# Patient Record
Sex: Male | Born: 1965 | Race: Black or African American | Hispanic: No | Marital: Single | Smoking: Former smoker
Health system: Southern US, Community
[De-identification: ages and names within clinical notes are randomized; demographics above are authoritative.]

## PROBLEM LIST (undated history)

## (undated) DIAGNOSIS — M20012 Mallet finger of left finger(s): Secondary | ICD-10-CM

## (undated) DIAGNOSIS — I5022 Chronic systolic (congestive) heart failure: Secondary | ICD-10-CM

## (undated) DIAGNOSIS — I252 Old myocardial infarction: Secondary | ICD-10-CM

## (undated) DIAGNOSIS — I10 Essential (primary) hypertension: Secondary | ICD-10-CM

## (undated) DIAGNOSIS — M7661 Achilles tendinitis, right leg: Secondary | ICD-10-CM

## (undated) DIAGNOSIS — M1000 Idiopathic gout, unspecified site: Secondary | ICD-10-CM

## (undated) DIAGNOSIS — E785 Hyperlipidemia, unspecified: Secondary | ICD-10-CM

## (undated) DIAGNOSIS — E119 Type 2 diabetes mellitus without complications: Secondary | ICD-10-CM

## (undated) HISTORY — DX: Mallet finger of left finger(s): M20.012

## (undated) HISTORY — DX: Type 2 diabetes mellitus without complications: E11.9

## (undated) HISTORY — DX: Achilles tendinitis, right leg: M76.61

## (undated) HISTORY — PX: NO PAST SURGERIES: SHX2092

## (undated) HISTORY — DX: Hyperlipidemia, unspecified: E78.5

## (undated) HISTORY — DX: Essential (primary) hypertension: I10

## (undated) HISTORY — DX: Chronic systolic (congestive) heart failure: I50.22

## (undated) HISTORY — DX: Old myocardial infarction: I25.2

## (undated) HISTORY — DX: Idiopathic gout, unspecified site: M10.00

---

## 2016-01-02 DIAGNOSIS — Z6829 Body mass index (BMI) 29.0-29.9, adult: Secondary | ICD-10-CM | POA: Diagnosis not present

## 2016-01-02 DIAGNOSIS — I10 Essential (primary) hypertension: Secondary | ICD-10-CM | POA: Diagnosis not present

## 2016-01-02 DIAGNOSIS — M10072 Idiopathic gout, left ankle and foot: Secondary | ICD-10-CM | POA: Diagnosis not present

## 2016-04-01 DIAGNOSIS — I10 Essential (primary) hypertension: Secondary | ICD-10-CM | POA: Diagnosis not present

## 2016-04-01 DIAGNOSIS — M10032 Idiopathic gout, left wrist: Secondary | ICD-10-CM | POA: Diagnosis not present

## 2016-04-01 DIAGNOSIS — E1165 Type 2 diabetes mellitus with hyperglycemia: Secondary | ICD-10-CM | POA: Diagnosis not present

## 2016-04-22 DIAGNOSIS — R109 Unspecified abdominal pain: Secondary | ICD-10-CM | POA: Diagnosis not present

## 2016-04-22 DIAGNOSIS — R350 Frequency of micturition: Secondary | ICD-10-CM | POA: Diagnosis not present

## 2016-04-22 DIAGNOSIS — R300 Dysuria: Secondary | ICD-10-CM | POA: Diagnosis not present

## 2016-04-22 DIAGNOSIS — N390 Urinary tract infection, site not specified: Secondary | ICD-10-CM | POA: Diagnosis not present

## 2016-05-20 DIAGNOSIS — Z6832 Body mass index (BMI) 32.0-32.9, adult: Secondary | ICD-10-CM | POA: Diagnosis not present

## 2016-05-20 DIAGNOSIS — N410 Acute prostatitis: Secondary | ICD-10-CM | POA: Diagnosis not present

## 2016-09-15 DIAGNOSIS — T33539A Superficial frostbite of unspecified finger(s), initial encounter: Secondary | ICD-10-CM | POA: Diagnosis not present

## 2016-10-05 DIAGNOSIS — E11319 Type 2 diabetes mellitus with unspecified diabetic retinopathy without macular edema: Secondary | ICD-10-CM | POA: Diagnosis not present

## 2016-11-11 DIAGNOSIS — I10 Essential (primary) hypertension: Secondary | ICD-10-CM | POA: Diagnosis not present

## 2016-11-11 DIAGNOSIS — M1000 Idiopathic gout, unspecified site: Secondary | ICD-10-CM | POA: Diagnosis not present

## 2016-11-11 DIAGNOSIS — E1165 Type 2 diabetes mellitus with hyperglycemia: Secondary | ICD-10-CM | POA: Diagnosis not present

## 2016-11-11 DIAGNOSIS — Z113 Encounter for screening for infections with a predominantly sexual mode of transmission: Secondary | ICD-10-CM | POA: Diagnosis not present

## 2016-11-11 DIAGNOSIS — E782 Mixed hyperlipidemia: Secondary | ICD-10-CM | POA: Diagnosis not present

## 2016-11-13 DIAGNOSIS — E782 Mixed hyperlipidemia: Secondary | ICD-10-CM | POA: Diagnosis not present

## 2016-11-13 DIAGNOSIS — N410 Acute prostatitis: Secondary | ICD-10-CM | POA: Diagnosis not present

## 2016-11-13 DIAGNOSIS — R0789 Other chest pain: Secondary | ICD-10-CM | POA: Diagnosis not present

## 2016-11-13 DIAGNOSIS — I10 Essential (primary) hypertension: Secondary | ICD-10-CM | POA: Diagnosis not present

## 2016-11-13 DIAGNOSIS — E1165 Type 2 diabetes mellitus with hyperglycemia: Secondary | ICD-10-CM | POA: Diagnosis not present

## 2016-11-13 DIAGNOSIS — R3129 Other microscopic hematuria: Secondary | ICD-10-CM | POA: Diagnosis not present

## 2016-11-13 DIAGNOSIS — M10072 Idiopathic gout, left ankle and foot: Secondary | ICD-10-CM | POA: Diagnosis not present

## 2017-01-04 DIAGNOSIS — R0602 Shortness of breath: Secondary | ICD-10-CM | POA: Diagnosis not present

## 2017-01-04 DIAGNOSIS — R079 Chest pain, unspecified: Secondary | ICD-10-CM | POA: Diagnosis not present

## 2017-01-04 DIAGNOSIS — R931 Abnormal findings on diagnostic imaging of heart and coronary circulation: Secondary | ICD-10-CM | POA: Diagnosis not present

## 2017-01-05 DIAGNOSIS — E782 Mixed hyperlipidemia: Secondary | ICD-10-CM | POA: Diagnosis not present

## 2017-01-05 DIAGNOSIS — I10 Essential (primary) hypertension: Secondary | ICD-10-CM | POA: Diagnosis not present

## 2017-01-05 DIAGNOSIS — E1165 Type 2 diabetes mellitus with hyperglycemia: Secondary | ICD-10-CM | POA: Diagnosis not present

## 2017-01-05 DIAGNOSIS — M1000 Idiopathic gout, unspecified site: Secondary | ICD-10-CM | POA: Diagnosis not present

## 2017-01-06 ENCOUNTER — Encounter: Payer: Self-pay | Admitting: *Deleted

## 2017-01-07 ENCOUNTER — Encounter: Payer: Self-pay | Admitting: Cardiovascular Disease

## 2017-01-07 ENCOUNTER — Ambulatory Visit (INDEPENDENT_AMBULATORY_CARE_PROVIDER_SITE_OTHER): Payer: BLUE CROSS/BLUE SHIELD | Admitting: Cardiovascular Disease

## 2017-01-07 VITALS — BP 144/90 | HR 76 | Ht 72.0 in | Wt 223.8 lb

## 2017-01-07 DIAGNOSIS — R0602 Shortness of breath: Secondary | ICD-10-CM | POA: Diagnosis not present

## 2017-01-07 DIAGNOSIS — E7849 Other hyperlipidemia: Secondary | ICD-10-CM

## 2017-01-07 DIAGNOSIS — I25118 Atherosclerotic heart disease of native coronary artery with other forms of angina pectoris: Secondary | ICD-10-CM | POA: Diagnosis not present

## 2017-01-07 DIAGNOSIS — E784 Other hyperlipidemia: Secondary | ICD-10-CM | POA: Diagnosis not present

## 2017-01-07 DIAGNOSIS — I10 Essential (primary) hypertension: Secondary | ICD-10-CM | POA: Diagnosis not present

## 2017-01-07 DIAGNOSIS — R0609 Other forms of dyspnea: Secondary | ICD-10-CM

## 2017-01-07 DIAGNOSIS — R9439 Abnormal result of other cardiovascular function study: Secondary | ICD-10-CM | POA: Diagnosis not present

## 2017-01-07 DIAGNOSIS — I209 Angina pectoris, unspecified: Secondary | ICD-10-CM | POA: Diagnosis not present

## 2017-01-07 DIAGNOSIS — R0600 Dyspnea, unspecified: Secondary | ICD-10-CM

## 2017-01-07 MED ORDER — ISOSORBIDE MONONITRATE ER 30 MG PO TB24: 30.0000 mg | ORAL_TABLET | Freq: Every day | ORAL | 6 refills | Status: DC

## 2017-01-07 MED ORDER — ASPIRIN EC 81 MG PO TBEC: 81.0000 mg | DELAYED_RELEASE_TABLET | Freq: Every day | ORAL | Status: DC

## 2017-01-07 MED ORDER — NITROGLYCERIN 0.4 MG SL SUBL: 0.4000 mg | SUBLINGUAL_TABLET | SUBLINGUAL | 3 refills | Status: AC | PRN

## 2017-01-07 MED ORDER — ROSUVASTATIN CALCIUM 5 MG PO TABS: 5.0000 mg | ORAL_TABLET | Freq: Every day | ORAL | 6 refills | Status: DC

## 2017-01-07 NOTE — Progress Notes (Signed)
CARDIOLOGY CONSULT NOTE  Patient ID: ALEK BORGES MRN: [id number] DOB/AGE: [birth year] 51 y.o.  Admit date: (Not on file) Primary Physician: Juliette Alcide, MD Referring Physician: Leandrew Koyanagi  Reason for Consultation: chest pain, abnormal stress test  HPI: Travis Paul is a 51 y.o. male who is being seen today for the evaluation of chest pain and an abnormal stress test at the request of Burdine, Ananias Pilgrim, MD.   He also has a history of hypertension and diabetes.  I reviewed all office documentation, labs, and studies.  Nuclear stress test performed at [facility] on 01/04/17 showed prior inferior wall infarct with no evidence of ischemia. There was inferior wall hypokinesis, LVEF 49%.  I personally reviewed the ECG performed on 11/12/16 which showed normal sinus rhythm with no ischemic ST segment or T-wave abnormalities, nor any arrhythmias. RSR prime pattern noted in V1.  HbA1c 6.3% on 11/11/16. BUN 11, creatinine 0.93, total cholesterol 218, triglycerides 647, HDL 42. LDL could not be calculated. Hemoglobin 12.9, platelets 283.  He continues to experience retrosternal chest pain and left precordial pain with exertion. It has been going on for one to two months. He denies associated nausea, vomiting, lightheadedness, dizziness, and syncope. He once broke out in a sweat with it.  He also has significant shortness of breath with exertion such as walking 50 yards. He was even short of breath walking from our parking lot into the office building.  There is no radiation of the chest pain into the jaw, back, neck, or arms.  He quit smoking in 2012.  He once had chest pain earlier this month alleviated with aspirin.  Blood pressure is elevated today, 144/90.  He tried statins and developed significant cramps.  ECG performed in the office today which I ordered and personally interpreted demonstrates normal sinus rhythm with no ischemic ST segment or T-wave  abnormalities, nor any arrhythmias.   Family history: Mother had CABG at 75.   No Known Allergies  Current Outpatient Prescriptions  Medication Sig Dispense Refill  . allopurinol (ZYLOPRIM) 300 MG tablet Take 300 mg by mouth daily.    Marland Kitchen amLODipine (NORVASC) 10 MG tablet Take 10 mg by mouth daily.    Marland Kitchen aspirin 325 MG tablet Take 325 mg by mouth daily.    . Chromium-Cinnamon (CINNAMON PLUS CHROMIUM) [phone] MCG-MG CAPS Take by mouth.    Boris Lown Oil (OMEGA-3) 500 MG CAPS Take by mouth.    Marland Kitchen lisinopril (PRINIVIL,ZESTRIL) 40 MG tablet Take 40 mg by mouth daily.    . metFORMIN (GLUCOPHAGE) 1000 MG tablet Take 1,000 mg by mouth 2 (two) times daily with a meal.    . metoprolol succinate (TOPROL-XL) 25 MG 24 hr tablet Take 25 mg by mouth daily.     No current facility-administered medications for this visit.     Past Medical History:  Diagnosis Date  . Achilles tendinitis, right leg   . Chronic systolic heart failure (HCC)   . Diabetes mellitus without complication (HCC)   . Hyperlipidemia   . Hypertension   . Idiopathic gout, unspecified site   . Mallet finger of left hand   . Old myocardial infarction     History reviewed. No pertinent surgical history.  Social History   Social History  . Marital status: Unknown    Spouse name: N/A  . Number of children: N/A  . Years of education: N/A   Occupational History  . Not on file.   Social  History Main Topics  . Smoking status: Former Smoker    Packs/day: 1.00    Types: Cigarettes    Quit date: 2012  . Smokeless tobacco: Never Used  . Alcohol use Not on file  . Drug use: Unknown  . Sexual activity: Not on file   Other Topics Concern  . Not on file   Social History Narrative  . No narrative on file       Current Meds  Medication Sig  . allopurinol (ZYLOPRIM) 300 MG tablet Take 300 mg by mouth daily.  Marland Kitchen amLODipine (NORVASC) 10 MG tablet Take 10 mg by mouth daily.  Marland Kitchen aspirin 325 MG tablet Take 325 mg by mouth  daily.  . Chromium-Cinnamon (CINNAMON PLUS CHROMIUM) [phone] MCG-MG CAPS Take by mouth.  Boris Lown Oil (OMEGA-3) 500 MG CAPS Take by mouth.  Marland Kitchen lisinopril (PRINIVIL,ZESTRIL) 40 MG tablet Take 40 mg by mouth daily.  . metFORMIN (GLUCOPHAGE) 1000 MG tablet Take 1,000 mg by mouth 2 (two) times daily with a meal.  . metoprolol succinate (TOPROL-XL) 25 MG 24 hr tablet Take 25 mg by mouth daily.      Review of systems complete and found to be negative unless listed above in HPI    Physical exam Blood pressure (!) 144/90, pulse 76, height 6' (1.829 m), weight 223 lb 12.8 oz (101.5 kg), SpO2 98 %. General: NAD Neck: No JVD, no thyromegaly or thyroid nodule.  Lungs: Diminished throughout, no Rales or wheezes. CV: Nondisplaced PMI. Regular rate and rhythm, normal S1/S2, no S3/S4, no murmur.  No peripheral edema.  No carotid bruit.    Abdomen: Soft, nontender, no distention.  Skin: Intact without lesions or rashes.  Neurologic: Alert and oriented x 3.  Psych: Normal affect. Extremities: No clubbing or cyanosis.  HEENT: Normal.   ECG: Most recent ECG reviewed.   Labs: No results found for: K, BUN, CREATININE, ALT, TSH, HGB   Lipids: No results found for: LDLCALC, LDLDIRECT, CHOL, TRIG, HDL      ASSESSMENT AND PLAN:  1. CAD with angina and exertional dyspnea: Appears to have CCS class III angina. I will attempt to control medically before considering coronary angiography. I will start Imdur 30 mg daily. I will start statin therapy with Crestor 5 mg. Already on ASA and a beta blocker. I will reduce ASA to 81 mg.  I will also prescribe SL nitroglycerin. No ischemia with stress testing. I will order a 2-D echocardiogram with Doppler to evaluate cardiac structure, function, and regional wall motion.  2. Hyperlipidemia: Lipids reviewed above. I will start statin therapy with Crestor 5 mg. I will repeat lipids in 3 months. He did develop cramps with statins in the past.  3. Hypertension:  Elevated. He is already on amlodipine 10 mg and lisinopril 40 mg. I will start Imdur 30 mg for chest pain and monitor BP.  Disposition: Follow up in 1 month  Signed: Prentice Docker, M.D., F.A.C.C.  01/07/2017, 11:13 AM

## 2017-01-07 NOTE — Patient Instructions (Signed)
Medication Instructions:   Begin Imdur  daily.  Begin Nitroglycerin 0.4mg  as needed for severe chest pain.  Begin Crestor  daily.  Decrease Aspirin to  daily.  Continue all other medications.    Labwork:  Lipids - order given today.    Due in 3 months (around April 08, 2017).  Office will contact with results via phone or letter.    Testing/Procedures:  Your physician has requested that you have an echocardiogram. Echocardiography is a painless test that uses sound waves to create images of your heart. It provides your doctor with information about the size and shape of your heart and how well your heart's chambers and valves are working. This procedure takes approximately one hour. There are no restrictions for this procedure.  Office will contact with results via phone or letter.    Follow-Up: 1 month   Any Other Special Instructions Will Be Listed Below (If Applicable).  If you need a refill on your cardiac medications before your next appointment, please call your pharmacy.

## 2017-01-08 ENCOUNTER — Encounter: Payer: Self-pay | Admitting: Cardiovascular Disease

## 2017-01-13 ENCOUNTER — Ambulatory Visit (INDEPENDENT_AMBULATORY_CARE_PROVIDER_SITE_OTHER): Payer: BLUE CROSS/BLUE SHIELD

## 2017-01-13 ENCOUNTER — Other Ambulatory Visit: Payer: Self-pay

## 2017-01-13 DIAGNOSIS — R0602 Shortness of breath: Secondary | ICD-10-CM

## 2017-01-13 DIAGNOSIS — I209 Angina pectoris, unspecified: Secondary | ICD-10-CM

## 2017-01-13 LAB — ECHOCARDIOGRAM COMPLETE
AO mean calculated velocity dopler: 112 cm/s
AOVTI: 26.9 cm
AV Mean grad: 6 mmHg
AV pk vel: 168 cm/s
AVPG: 11 mmHg
CHL CUP TV REG PEAK VELOCITY: 156 cm/s
E decel time: 301 msec
EERAT: 6.69
FS: 41 % (ref 28–44)
IVS/LV PW RATIO, ED: 1.05
LA diam end sys: 40 mm
LA diam index: 1.79 cm/m2
LA vol A4C: 29.4 ml
LA vol index: 16.7 mL/m2
LA vol: 37.3 mL
LASIZE: 40 mm
LDCA: 2.84 cm2
LV E/e'average: 6.69
LV PW d: 12.7 mm — AB (ref 0.6–1.1)
LV dias vol: 40 mL — AB (ref 62–150)
LV e' LATERAL: 9.28 cm/s
LV sys vol index: 5 mL/m2
LV sys vol: 11 mL — AB (ref 21–61)
LVDIAVOLIN: 18 mL/m2
LVEEMED: 6.69
LVOT diameter: 19 mm
MV Dec: 301
MV pk A vel: 108 m/s
MV pk E vel: 62.1 m/s
RV LATERAL S' VELOCITY: 16.5 cm/s
RV TAPSE: 19.1 mm
RV sys press: 13 mmHg
Simpson's disk: 72
Stroke v: 29 ml
TDI e' lateral: 9.28
TDI e' medial: 8.51
TR max vel: 156 cm/s

## 2017-01-14 ENCOUNTER — Telehealth: Payer: Self-pay | Admitting: *Deleted

## 2017-01-14 DIAGNOSIS — I5022 Chronic systolic (congestive) heart failure: Secondary | ICD-10-CM | POA: Diagnosis not present

## 2017-01-14 DIAGNOSIS — I10 Essential (primary) hypertension: Secondary | ICD-10-CM | POA: Diagnosis not present

## 2017-01-14 DIAGNOSIS — M7661 Achilles tendinitis, right leg: Secondary | ICD-10-CM | POA: Diagnosis not present

## 2017-01-14 DIAGNOSIS — E782 Mixed hyperlipidemia: Secondary | ICD-10-CM | POA: Diagnosis not present

## 2017-01-14 NOTE — Telephone Encounter (Signed)
Notes recorded by Lesle ChrisAngela G Hill, LPN on 4/0/98115/11/2016 at 10:16 AM EDT Patient notified. Copy to pmd. Follow up scheduled for 02/10/2017 with Dr. Purvis SheffieldKoneswaran. ------  Notes recorded by Lesle ChrisAngela G Hill, LPN on 9/1/47825/11/2016 at 9:44 AM EDT Left message to return call.  ------  Notes recorded by Laqueta LindenSuresh A Koneswaran, MD on 01/13/2017 at 4:53 PM EDT Excellent pumping function.

## 2017-02-10 ENCOUNTER — Encounter: Payer: Self-pay | Admitting: Cardiovascular Disease

## 2017-02-10 ENCOUNTER — Ambulatory Visit (INDEPENDENT_AMBULATORY_CARE_PROVIDER_SITE_OTHER): Payer: BLUE CROSS/BLUE SHIELD | Admitting: Cardiovascular Disease

## 2017-02-10 VITALS — BP 133/85 | HR 67 | Ht 72.0 in | Wt 218.4 lb

## 2017-02-10 DIAGNOSIS — I209 Angina pectoris, unspecified: Secondary | ICD-10-CM | POA: Diagnosis not present

## 2017-02-10 DIAGNOSIS — I10 Essential (primary) hypertension: Secondary | ICD-10-CM | POA: Diagnosis not present

## 2017-02-10 DIAGNOSIS — R0600 Dyspnea, unspecified: Secondary | ICD-10-CM

## 2017-02-10 DIAGNOSIS — R9439 Abnormal result of other cardiovascular function study: Secondary | ICD-10-CM | POA: Diagnosis not present

## 2017-02-10 DIAGNOSIS — R0602 Shortness of breath: Secondary | ICD-10-CM

## 2017-02-10 DIAGNOSIS — E784 Other hyperlipidemia: Secondary | ICD-10-CM

## 2017-02-10 DIAGNOSIS — I25118 Atherosclerotic heart disease of native coronary artery with other forms of angina pectoris: Secondary | ICD-10-CM

## 2017-02-10 DIAGNOSIS — R0609 Other forms of dyspnea: Secondary | ICD-10-CM | POA: Diagnosis not present

## 2017-02-10 DIAGNOSIS — E7849 Other hyperlipidemia: Secondary | ICD-10-CM

## 2017-02-10 NOTE — Patient Instructions (Signed)
Medication Instructions:  Continue all current medications.  Labwork: none  Testing/Procedures: none  Follow-Up: 3 months   Any Other Special Instructions Will Be Listed Below (If Applicable).  If you need a refill on your cardiac medications before your next appointment, please call your pharmacy.  

## 2017-02-10 NOTE — Progress Notes (Signed)
SUBJECTIVE: The patient presents for follow-up of coronary artery disease with angina and exertional dyspnea.  Nuclear stress test performed at [facility] on 01/04/17 showed prior inferior wall infarct with no evidence of ischemia. There was inferior wall hypokinesis, LVEF 49%.  At his last visit I ordered an echocardiogram which was performed on 01/13/17 and demonstrated vigorous left ventricular systolic function, LVEF 70-75%, mild LVH, and grade 1 diastolic dysfunction. Pulmonary pressures were normal.  He is feeling much better after the addition of Imdur 30 mg. He currently denies chest pain and said his shortness of breath has significantly improved. He is trying to eat better and has questions about olive oil and steaming meats. He is also trying to walk more.  [place] at this office visit. He was in [place] last week.   Review of Systems: As per "subjective", otherwise negative.  No Known Allergies  Current Outpatient Prescriptions  Medication Sig Dispense Refill  . allopurinol (ZYLOPRIM) 300 MG tablet Take 300 mg by mouth daily.    Marland Kitchen. amLODipine (NORVASC) 10 MG tablet Take 10 mg by mouth daily.    Marland Kitchen. aspirin EC 81 MG tablet Take 1 tablet (81 mg total) by mouth daily.    . Chromium-Cinnamon (CINNAMON PLUS CHROMIUM) [phone] MCG-MG CAPS Take by mouth.    . isosorbide mononitrate (IMDUR) 30 MG 24 hr tablet Take 1 tablet (30 mg total) by mouth daily. 30 tablet 6  . Krill Oil (OMEGA-3) 500 MG CAPS Take by mouth.    Marland Kitchen. lisinopril (PRINIVIL,ZESTRIL) 40 MG tablet Take 40 mg by mouth daily.    . metFORMIN (GLUCOPHAGE) 1000 MG tablet Take 1,000 mg by mouth 2 (two) times daily with a meal.    . metoprolol succinate (TOPROL-XL) 25 MG 24 hr tablet Take 25 mg by mouth daily.    . nitroGLYCERIN (NITROSTAT) 0.4 MG SL tablet Place 1 tablet (0.4 mg total) under the tongue every 5 (five) minutes as needed for chest pain. 25 tablet 3  .  rosuvastatin (CRESTOR) 5 MG tablet Take 1 tablet (5 mg total) by mouth daily. 30 tablet 6   No current facility-administered medications for this visit.     Past Medical History:  Diagnosis Date  . Achilles tendinitis, right leg   . Chronic systolic heart failure (HCC)   . Diabetes mellitus without complication (HCC)   . Hyperlipidemia   . Hypertension   . Idiopathic gout, unspecified site   . Mallet finger of left hand   . Old myocardial infarction     History reviewed. No pertinent surgical history.  Social History   Social History  . Marital status: Unknown    Spouse name: N/A  . Number of children: N/A  . Years of education: N/A   Occupational History  . Not on file.   Social History Main Topics  . Smoking status: Former Smoker    Packs/day: 1.00    Types: Cigarettes    Quit date: 2012  . Smokeless tobacco: Never Used  . Alcohol use Not on file  . Drug use: Unknown  . Sexual activity: Not on file   Other Topics Concern  . Not on file   Social History Narrative  . No narrative on file     Vitals:   02/10/17 0940  BP: 133/85  Pulse: 67  SpO2: 100%  Weight: 218 lb 6.4 oz (99.1 kg)  Height: 6' (1.829 m)    [employer]  Readings from Last 3 Encounters:  02/10/17 218 lb 6.4 oz (99.1 kg)  01/07/17 223 lb 12.8 oz (101.5 kg)  01/05/17 226 lb (102.5 kg)     PHYSICAL EXAM General: NAD HEENT: Normal. Neck: No JVD, no thyromegaly. Lungs: Diminished throughout, no crackles or wheezes. CV: Nondisplaced PMI.  Regular rate and rhythm, normal S1/S2, no S3/S4, no murmur. No pretibial or periankle edema.     Abdomen: Soft, nontender, no distention.  Neurologic: Alert and oriented.  Psych: Normal affect. Skin: Normal. Musculoskeletal: No gross deformities.    ECG: Most recent ECG reviewed.   Labs: No results found for: K, BUN, CREATININE, ALT, TSH, HGB   Lipids: No results found for: LDLCALC, LDLDIRECT, CHOL, TRIG, HDL     ASSESSMENT AND PLAN: 1. CAD with  angina and exertional dyspnea: Symptomatically improved after the addition of Imdur 30 mg. He is tolerating Crestor 5 mg. Continue aspirin 81 mg and beta blocker. There was no ischemia with stress testing. He has vigorous left ventricular systolic function.  2. Hyperlipidemia: Lipids previously reviewed. I started Crestor 5 mg at his last visit which he is tolerating. Lipids to be repeated in 2 months. He did develop cramps with statins in the past.  3. Hypertension: Currently controlled on amlodipine 10 mg, lisinopril 40 mg, and Imdur 30 mg. No changes.    Disposition: Follow up 3 months.  Prentice Docker, M.D., F.A.C.C.

## 2017-04-13 ENCOUNTER — Ambulatory Visit: Payer: BLUE CROSS/BLUE SHIELD | Admitting: Cardiovascular Disease

## 2017-05-18 DIAGNOSIS — E1165 Type 2 diabetes mellitus with hyperglycemia: Secondary | ICD-10-CM | POA: Diagnosis not present

## 2017-05-18 DIAGNOSIS — E782 Mixed hyperlipidemia: Secondary | ICD-10-CM | POA: Diagnosis not present

## 2017-05-18 DIAGNOSIS — I10 Essential (primary) hypertension: Secondary | ICD-10-CM | POA: Diagnosis not present

## 2017-05-19 ENCOUNTER — Telehealth: Payer: Self-pay | Admitting: Cardiovascular Disease

## 2017-05-19 DIAGNOSIS — I25119 Atherosclerotic heart disease of native coronary artery with unspecified angina pectoris: Secondary | ICD-10-CM | POA: Diagnosis not present

## 2017-05-19 DIAGNOSIS — E782 Mixed hyperlipidemia: Secondary | ICD-10-CM | POA: Diagnosis not present

## 2017-05-19 DIAGNOSIS — I5022 Chronic systolic (congestive) heart failure: Secondary | ICD-10-CM | POA: Diagnosis not present

## 2017-05-19 DIAGNOSIS — E1165 Type 2 diabetes mellitus with hyperglycemia: Secondary | ICD-10-CM | POA: Diagnosis not present

## 2017-05-19 NOTE — Telephone Encounter (Signed)
Please give Dr. Leandrew KoyanagiBurdine [phone] a call concerning the pt's lipid profile.

## 2017-05-19 NOTE — Telephone Encounter (Signed)
Spoke with Clinton SawyerKailey at Dr Debbora PrestoBurdines office who says nurses have left for the day and she didn't see anything documented in pt chart - LM

## 2017-05-20 ENCOUNTER — Ambulatory Visit (INDEPENDENT_AMBULATORY_CARE_PROVIDER_SITE_OTHER): Payer: BLUE CROSS/BLUE SHIELD | Admitting: Cardiovascular Disease

## 2017-05-20 ENCOUNTER — Encounter: Payer: Self-pay | Admitting: Cardiovascular Disease

## 2017-05-20 VITALS — BP 119/77 | HR 85 | Ht 72.0 in | Wt 230.0 lb

## 2017-05-20 DIAGNOSIS — I209 Angina pectoris, unspecified: Secondary | ICD-10-CM

## 2017-05-20 DIAGNOSIS — I10 Essential (primary) hypertension: Secondary | ICD-10-CM | POA: Diagnosis not present

## 2017-05-20 DIAGNOSIS — E781 Pure hyperglyceridemia: Secondary | ICD-10-CM

## 2017-05-20 DIAGNOSIS — E785 Hyperlipidemia, unspecified: Secondary | ICD-10-CM | POA: Diagnosis not present

## 2017-05-20 DIAGNOSIS — I25118 Atherosclerotic heart disease of native coronary artery with other forms of angina pectoris: Secondary | ICD-10-CM | POA: Diagnosis not present

## 2017-05-20 NOTE — Telephone Encounter (Signed)
Spoke with Clinton SawyerKailey at Dr Burdine's office says they will fax recent OV regarding medication changes for pt - pt was seen by Dr Purvis SheffieldKoneswaran today

## 2017-05-20 NOTE — Progress Notes (Signed)
SUBJECTIVE: The patient presents for follow-up of coronary artery disease with angina and exertional dyspnea.  Nuclear stress [facility] performed at [facility] on 01/04/17 showed prior inferior wall infarct with no evidence of ischemia. There was inferior wall hypokinesis, LVEF 49%.  Echocardiogram on 01/13/17 and demonstrated vigorous left ventricular systolic function, LVEF 70-75%, mild LVH, and grade 1 diastolic dysfunction. Pulmonary pressures were normal.  He is feeling very well and denies chest pain and shortness of breath.  Labs were checked 2 days ago and triglycerides were severely elevated at 2070. They had been 647 in February. Total cholesterol was 229 HDL was 24. Hemoglobin 12.7, platelets 212.  He said his PCP made some medication adjustments but he has yet to go fill his prescriptions and does not know what they are at present.    Review of Systems: As per "subjective", otherwise negative.  No Known Allergies  Current Outpatient Prescriptions  Medication Sig Dispense Refill  . allopurinol (ZYLOPRIM) 300 MG tablet Take 300 mg by mouth daily.    Marland Kitchen. amLODipine (NORVASC) 10 MG tablet Take 10 mg by mouth daily.    Marland Kitchen. aspirin EC 81 MG tablet Take 1 tablet (81 mg total) by mouth daily.    . Chromium-Cinnamon (CINNAMON PLUS CHROMIUM) [phone] MCG-MG CAPS Take by mouth.    . isosorbide mononitrate (IMDUR) 30 MG 24 hr tablet Take 1 tablet (30 mg total) by mouth daily. 30 tablet 6  . Krill Oil (OMEGA-3) 500 MG CAPS Take by mouth.    Marland Kitchen. lisinopril (PRINIVIL,ZESTRIL) 40 MG tablet Take 40 mg by mouth daily.    . metFORMIN (GLUCOPHAGE) 1000 MG tablet Take 1,000 mg by mouth 2 (two) times daily with a meal.    . metoprolol succinate (TOPROL-XL) 25 MG 24 hr tablet Take 25 mg by mouth daily.    . nitroGLYCERIN (NITROSTAT) 0.4 MG SL tablet Place 1 tablet (0.4 mg total) under the tongue every 5 (five) minutes as needed for chest pain. 25 tablet 3  . rosuvastatin (CRESTOR) 5 MG tablet  Take 1 tablet (5 mg total) by mouth daily. 30 tablet 6   No current facility-administered medications for this visit.     Past Medical History:  Diagnosis Date  . Achilles tendinitis, right leg   . Chronic systolic heart failure (HCC)   . Diabetes mellitus without complication (HCC)   . Hyperlipidemia   . Hypertension   . Idiopathic gout, unspecified site   . Mallet finger of left hand   . Old myocardial infarction     Past Surgical History:  Procedure Laterality Date  . NO PAST SURGERIES      Social History   Social History  . Marital status: Unknown    Spouse name: N/A  . Number of children: N/A  . Years of education: N/A   Occupational History  . Not on file.   Social History Main Topics  . Smoking status: Former Smoker    Packs/day: 1.00    Types: Cigarettes    Quit date: 2012  . Smokeless tobacco: Never Used  . Alcohol use Not on file  . Drug use: Unknown  . Sexual activity: Not on file   Other Topics Concern  . Not on file   Social History Narrative  . No narrative on file     Vitals:   05/20/17 1313  Weight: 230 lb (104.3 kg)  Height: 6' (1.829 m)    Wt Readings from Last 3 Encounters:  05/20/17  230 lb (104.3 kg)  02/10/17 218 lb 6.4 oz (99.1 kg)  01/07/17 223 lb 12.8 oz (101.5 kg)     PHYSICAL EXAM General: NAD HEENT: Normal. Neck: No JVD, no thyromegaly. Lungs: Diminished throughout, no crackles or wheezes. CV: Nondisplaced PMI.  Regular rate and rhythm, normal S1/S2, no S3/S4, no murmur. No pretibial or periankle edema.  No carotid bruit.   Abdomen: Soft, nontender, no distention.  Neurologic: Alert and oriented.  Psych: Normal affect. Skin: Normal. Musculoskeletal: No gross deformities.    ECG: Most recent ECG reviewed.   Labs: No results found for: K, BUN, CREATININE, ALT, TSH, HGB   Lipids: No results found for: LDLCALC, LDLDIRECT, CHOL, TRIG, HDL     ASSESSMENT AND PLAN:  1. CAD with angina and exertional dyspnea:  Symptomatically improved after the addition of Imdur 30 mg. He is tolerating Crestor 5 mg. Continue aspirin 81 mg and beta blocker. There was no ischemia with stress testing. He has vigorous left ventricular systolic function.  2. Dyslipidemia with severe hypertriglyceridemia: Labs were checked 2 days ago and triglycerides were severely elevated at 2070. They had been 647 in February. Total cholesterol was 229 HDL was 24. He said his PCP made some medication adjustments but he has yet to go fill his prescriptions and does not know what they are at present.The patient will go have his prescriptions filled and inform us of his new medications and dosages. He is at risk for pancreatitis.  3. Hypertension: Currently controlled. No changes.     Disposition: Follow up 6 months.   Prentice Docker, M.D., F.A.C.C.

## 2017-05-20 NOTE — Patient Instructions (Signed)

## 2017-05-28 ENCOUNTER — Other Ambulatory Visit: Payer: Self-pay | Admitting: *Deleted

## 2017-05-28 MED ORDER — AMLODIPINE BESY-BENAZEPRIL HCL 10-40 MG PO CAPS
1.0000 | ORAL_CAPSULE | Freq: Every day | ORAL | Status: DC
Start: 2017-05-28 — End: 2019-06-06

## 2017-05-28 MED ORDER — ROSUVASTATIN CALCIUM 10 MG PO TABS: 10.0000 mg | ORAL_TABLET | Freq: Every day | ORAL | Status: DC

## 2017-05-28 MED ORDER — OMEGA-3 KRILL OIL 1000 MG PO CAPS: 1.0000 | ORAL_CAPSULE | Freq: Two times a day (BID) | ORAL | Status: DC

## 2017-05-28 MED ORDER — FENOFIBRATE 160 MG PO TABS: 160.0000 mg | ORAL_TABLET | Freq: Every day | ORAL | Status: DC

## 2017-08-12 DIAGNOSIS — E1165 Type 2 diabetes mellitus with hyperglycemia: Secondary | ICD-10-CM | POA: Diagnosis not present

## 2017-08-12 DIAGNOSIS — I5022 Chronic systolic (congestive) heart failure: Secondary | ICD-10-CM | POA: Diagnosis not present

## 2017-08-12 DIAGNOSIS — E782 Mixed hyperlipidemia: Secondary | ICD-10-CM | POA: Diagnosis not present

## 2017-08-12 DIAGNOSIS — I10 Essential (primary) hypertension: Secondary | ICD-10-CM | POA: Diagnosis not present

## 2017-08-16 DIAGNOSIS — E1165 Type 2 diabetes mellitus with hyperglycemia: Secondary | ICD-10-CM | POA: Diagnosis not present

## 2017-08-16 DIAGNOSIS — I10 Essential (primary) hypertension: Secondary | ICD-10-CM | POA: Diagnosis not present

## 2017-08-16 DIAGNOSIS — M10072 Idiopathic gout, left ankle and foot: Secondary | ICD-10-CM | POA: Diagnosis not present

## 2017-08-16 DIAGNOSIS — Z1389 Encounter for screening for other disorder: Secondary | ICD-10-CM | POA: Diagnosis not present

## 2017-08-16 DIAGNOSIS — E782 Mixed hyperlipidemia: Secondary | ICD-10-CM | POA: Diagnosis not present

## 2017-08-20 ENCOUNTER — Other Ambulatory Visit: Payer: Self-pay | Admitting: Cardiovascular Disease

## 2018-02-04 DIAGNOSIS — Z0001 Encounter for general adult medical examination with abnormal findings: Secondary | ICD-10-CM | POA: Diagnosis not present

## 2018-02-08 DIAGNOSIS — Z0001 Encounter for general adult medical examination with abnormal findings: Secondary | ICD-10-CM | POA: Diagnosis not present

## 2018-02-08 DIAGNOSIS — Z6831 Body mass index (BMI) 31.0-31.9, adult: Secondary | ICD-10-CM | POA: Diagnosis not present

## 2018-02-28 DIAGNOSIS — Z114 Encounter for screening for human immunodeficiency virus [HIV]: Secondary | ICD-10-CM | POA: Diagnosis not present

## 2018-02-28 DIAGNOSIS — Z113 Encounter for screening for infections with a predominantly sexual mode of transmission: Secondary | ICD-10-CM | POA: Diagnosis not present

## 2018-04-22 ENCOUNTER — Ambulatory Visit: Payer: BLUE CROSS/BLUE SHIELD | Admitting: Cardiovascular Disease

## 2018-05-30 ENCOUNTER — Ambulatory Visit: Payer: BLUE CROSS/BLUE SHIELD | Admitting: Cardiovascular Disease

## 2018-06-11 ENCOUNTER — Other Ambulatory Visit: Payer: Self-pay | Admitting: Cardiovascular Disease

## 2018-06-16 DIAGNOSIS — E1165 Type 2 diabetes mellitus with hyperglycemia: Secondary | ICD-10-CM | POA: Diagnosis not present

## 2018-06-16 DIAGNOSIS — E782 Mixed hyperlipidemia: Secondary | ICD-10-CM | POA: Diagnosis not present

## 2018-06-16 DIAGNOSIS — R3121 Asymptomatic microscopic hematuria: Secondary | ICD-10-CM | POA: Diagnosis not present

## 2018-06-16 DIAGNOSIS — M10072 Idiopathic gout, left ankle and foot: Secondary | ICD-10-CM | POA: Diagnosis not present

## 2018-06-16 DIAGNOSIS — I10 Essential (primary) hypertension: Secondary | ICD-10-CM | POA: Diagnosis not present

## 2018-09-13 ENCOUNTER — Ambulatory Visit: Payer: BLUE CROSS/BLUE SHIELD | Admitting: Cardiovascular Disease

## 2018-09-13 ENCOUNTER — Encounter: Payer: Self-pay | Admitting: Cardiovascular Disease

## 2018-09-13 VITALS — BP 132/72 | HR 77 | Ht 72.0 in | Wt 245.0 lb

## 2018-09-13 DIAGNOSIS — I10 Essential (primary) hypertension: Secondary | ICD-10-CM

## 2018-09-13 DIAGNOSIS — I25118 Atherosclerotic heart disease of native coronary artery with other forms of angina pectoris: Secondary | ICD-10-CM

## 2018-09-13 DIAGNOSIS — E785 Hyperlipidemia, unspecified: Secondary | ICD-10-CM

## 2018-09-13 NOTE — Patient Instructions (Addendum)

## 2018-09-13 NOTE — Progress Notes (Signed)
SUBJECTIVE: The patient presents for follow-up of coronary artery disease.  Nuclear stress test performed at [facility] on 01/04/17 showed prior inferior wall infarct with no evidence of ischemia. There was inferior wall hypokinesis, LVEF 49%.  Echocardiogram on 01/13/17 and demonstrated vigorous left ventricular systolic function, LVEF 70-75%, mild LVH, and grade 1 diastolic dysfunction. Pulmonary pressures were normal.  He has some occasional shortness of breath which he attributes to weight gain.  He weighs 245 pounds today and weighed 230 pounds on 05/20/2017 and prior to that he weighed 218 pounds on 02/10/2017.  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  He had some chest pains alleviated with belching the other night but otherwise denies exertional chest pain.  He also denies palpitations, orthopnea, and leg swelling.  ECG performed in the office today which I ordered and personally interpreted demonstrates normal sinus rhythm with early repolarization changes.   Review of Systems: As per "subjective", otherwise negative.  No Known Allergies  Current Outpatient Medications  Medication Sig Dispense Refill  . allopurinol (ZYLOPRIM) 300 MG tablet Take 300 mg by mouth daily.    Marland Kitchen. amLODipine-benazepril (LOTREL) 10-40 MG capsule Take 1 capsule by mouth daily.    Marland Kitchen. aspirin EC 81 MG tablet Take 1 tablet (81 mg total) by mouth daily.    . Chromium-Cinnamon (CINNAMON PLUS CHROMIUM) [phone] MCG-MG CAPS Take 1 capsule by mouth 2 (two) times daily.    . fenofibrate 160 MG tablet Take 1 tablet (160 mg total) by mouth daily.    . isosorbide mononitrate (IMDUR) 30 MG 24 hr tablet TAKE 1 TABLET BY MOUTH ONCE DAILY 90 tablet 0  . metFORMIN (GLUCOPHAGE) 1000 MG tablet Take 1,000 mg by mouth 2 (two) times daily with a meal.    . nitroGLYCERIN (NITROSTAT) 0.4 MG SL tablet Place 1 tablet (0.4 mg total) under the tongue every 5 (five) minutes as needed for chest pain. 25  tablet 3  . Omega-3 Krill Oil 1000 MG CAPS Take 1 capsule by mouth 2 (two) times daily.    . rosuvastatin (CRESTOR) 10 MG tablet Take 1 tablet (10 mg total) by mouth daily.     No current facility-administered medications for this visit.     Past Medical History:  Diagnosis Date  . Achilles tendinitis, right leg   . Chronic systolic heart failure (HCC)   . Diabetes mellitus without complication (HCC)   . Hyperlipidemia   . Hypertension   . Idiopathic gout, unspecified site   . Mallet finger of left hand   . Old myocardial infarction     Past Surgical History:  Procedure Laterality Date  . NO PAST SURGERIES      Social History   Socioeconomic History  . Marital status: Unknown    Spouse name: Not on file  . Number of children: Not on file  . Years of education: Not on file  . Highest education level: Not on file  Occupational History  . Not on file  Social Needs  . Financial resource strain: Not on file  . Food insecurity:    Worry: Not on file    Inability: Not on file  . Transportation needs:    Medical: Not on file    Non-medical: Not on file  Tobacco Use  . Smoking status: Former Smoker    Packs/day: 1.00    Types: Cigarettes    Last attempt to quit: 2012    Years since quitting:  8.0  . Smokeless tobacco: Never Used  Substance and Sexual Activity  . Alcohol use: Not on file  . Drug use: Not on file  . Sexual activity: Not on file  Lifestyle  . Physical activity:    Days per week: Not on file    Minutes per session: Not on file  . Stress: Not on file  Relationships  . Social connections:    Talks on phone: Not on file    Gets together: Not on file    Attends religious service: Not on file    Active member of club or organization: Not on file    Attends meetings of clubs or organizations: Not on file    Relationship status: Not on file  . Intimate partner violence:    Fear of current or ex partner: Not on file    Emotionally abused: Not on file     Physically abused: Not on file    Forced sexual activity: Not on file  Other Topics Concern  . Not on file  Social History Narrative  . Not on file     Vitals:   09/13/18 1044  BP: 132/72  Pulse: 77  SpO2: 95%  Weight: 245 lb (111.1 kg)  Height: 6' (1.829 m)    Wt Readings from Last 3 Encounters:  09/13/18 245 lb (111.1 kg)  05/20/17 230 lb (104.3 kg)  02/10/17 218 lb 6.4 oz (99.1 kg)     PHYSICAL EXAM General: NAD HEENT: Normal. Neck: No JVD, no thyromegaly. Lungs: Diminished sounds throughout, no crackles or wheezes. CV: Regular rate and rhythm, normal S1/S2, no S3/S4, no murmur. No pretibial or periankle edema.  No carotid bruit.   Abdomen: Soft, nontender, no distention.  Neurologic: Alert and oriented.  Psych: Normal affect. Skin: Normal. Musculoskeletal: No gross deformities.    ECG: Reviewed above under Subjective   Labs: No results found for: K, BUN, CREATININE, ALT, TSH, HGB   Lipids: No results found for: LDLCALC, LDLDIRECT, CHOL, TRIG, HDL     ASSESSMENT AND PLAN: 1.  Coronary artery disease: Symptomatically stable.  Currently on aspirin and Crestor along with Imdur.  No longer on beta-blockers.  I emphasized the importance of increasing physical activity levels for weight loss.  2.  Dyslipidemia: I will obtain a copy of lipids from PCP.  He has a history of severe hypertriglyceridemia.  Currently on Crestor and fenofibrate.  3.  Hypertension: Blood pressure is normal.  No changes.    Disposition: Follow up 1 year   Prentice DockerSuresh Koneswaran, M.D., F.A.C.C.

## 2018-09-16 ENCOUNTER — Encounter: Payer: Self-pay | Admitting: *Deleted

## 2018-11-15 DIAGNOSIS — I5022 Chronic systolic (congestive) heart failure: Secondary | ICD-10-CM | POA: Diagnosis not present

## 2018-11-15 DIAGNOSIS — E1165 Type 2 diabetes mellitus with hyperglycemia: Secondary | ICD-10-CM | POA: Diagnosis not present

## 2018-11-15 DIAGNOSIS — I10 Essential (primary) hypertension: Secondary | ICD-10-CM | POA: Diagnosis not present

## 2018-11-15 DIAGNOSIS — E782 Mixed hyperlipidemia: Secondary | ICD-10-CM | POA: Diagnosis not present

## 2018-12-02 DIAGNOSIS — I5022 Chronic systolic (congestive) heart failure: Secondary | ICD-10-CM | POA: Diagnosis not present

## 2018-12-02 DIAGNOSIS — R3121 Asymptomatic microscopic hematuria: Secondary | ICD-10-CM | POA: Diagnosis not present

## 2018-12-02 DIAGNOSIS — E1165 Type 2 diabetes mellitus with hyperglycemia: Secondary | ICD-10-CM | POA: Diagnosis not present

## 2018-12-02 DIAGNOSIS — I10 Essential (primary) hypertension: Secondary | ICD-10-CM | POA: Diagnosis not present

## 2018-12-20 ENCOUNTER — Other Ambulatory Visit: Payer: Self-pay | Admitting: Cardiovascular Disease

## 2018-12-21 ENCOUNTER — Encounter: Payer: Self-pay | Admitting: Internal Medicine

## 2019-02-13 ENCOUNTER — Encounter: Payer: Self-pay | Admitting: Internal Medicine

## 2019-03-06 ENCOUNTER — Ambulatory Visit: Payer: BLUE CROSS/BLUE SHIELD

## 2019-06-06 ENCOUNTER — Ambulatory Visit (INDEPENDENT_AMBULATORY_CARE_PROVIDER_SITE_OTHER): Payer: Self-pay | Admitting: *Deleted

## 2019-06-06 ENCOUNTER — Other Ambulatory Visit: Payer: Self-pay

## 2019-06-06 DIAGNOSIS — Z86010 Personal history of colonic polyps: Secondary | ICD-10-CM

## 2019-06-06 MED ORDER — PEG 3350-KCL-NA BICARB-NACL 420 G PO SOLR: 4000.0000 mL | Freq: Once | ORAL | 0 refills | Status: AC

## 2019-06-06 NOTE — Progress Notes (Signed)
Gastroenterology Pre-Procedure Review  Request Date: 06/06/2019 Requesting Physician: Dr. Judd Lien @ [facility], Last TCS 08/17/2011 done by Dr. Truddie Hidden, sessile polyp  PATIENT REVIEW QUESTIONS: The patient responded to the following health history questions as indicated:    1. Diabetes Melitis: Yes 2. Joint replacements in the past 12 months: No 3. Major health problems in the past 3 months: No 4. Has an artificial valve or MVP: No 5. Has a defibrillator: No 6. Has been advised in past to take antibiotics in advance of a procedure like teeth cleaning: No 7. Family history of colon cancer: No  8. Alcohol Use: Yes, 1 beer on weekends 9. Illicit drug Use: No 10. History of sleep apnea: No  11. History of coronary artery or other vascular stents placed within the last 12 months: No 12. History of any prior anesthesia complications: No 13. There is no height or weight on file to calculate BMI. Ht: 5'11 Wt: 240 lbs    MEDICATIONS & ALLERGIES:    Patient reports the following regarding taking any blood thinners:   Plavix? No Aspirin? Yes Coumadin? No Brilinta? No Xarelto? No Eliquis? No Pradaxa? No Savaysa? No Effient? No  Patient confirms/reports the following medications:  Current Outpatient Medications  Medication Sig Dispense Refill  . allopurinol (ZYLOPRIM) 300 MG tablet Take 300 mg by mouth daily.    Marland Kitchen amLODipine (NORVASC) 10 MG tablet Take 10 mg by mouth daily.    Marland Kitchen aspirin EC 81 MG tablet Take 1 tablet (81 mg total) by mouth daily.    . benazepril (LOTENSIN) 40 MG tablet Take 40 mg by mouth daily.    . Chromium-Cinnamon (CINNAMON PLUS CHROMIUM) [phone] MCG-MG CAPS Take 1 capsule by mouth as needed.     . fenofibrate 160 MG tablet Take 1 tablet (160 mg total) by mouth daily.    . isosorbide mononitrate (IMDUR) 30 MG 24 hr tablet Take 1 tablet by mouth once daily 90 tablet 3  . metFORMIN (GLUCOPHAGE) 1000 MG tablet Take 1,000 mg by mouth 2 (two) times daily with a meal.     . nitroGLYCERIN (NITROSTAT) 0.4 MG SL tablet Place 1 tablet (0.4 mg total) under the tongue every 5 (five) minutes as needed for chest pain. (Patient taking differently: Place 0.4 mg under the tongue as needed for chest pain. ) 25 tablet 3  . rosuvastatin (CRESTOR) 10 MG tablet Take 1 tablet (10 mg total) by mouth daily.     No current facility-administered medications for this visit.     Patient confirms/reports the following allergies:  No Known Allergies  No orders of the defined types were placed in this encounter.   AUTHORIZATION INFORMATION Primary Insurance:BCBS [place],  ID #:[id number] ,  Group #: [id number] Pre-Cert / Auth required: No, not required  SCHEDULE INFORMATION: Procedure has been scheduled as follows:  Date: 07/21/2019, Time: 9:30 Location: [facility] with Dr. Gala Romney  This Gastroenterology Pre-Precedure Review Form is being routed to the following provider(s): Walden Field, NP

## 2019-06-06 NOTE — Patient Instructions (Signed)
Travis Paul   Apr 08, 1966 MRN: [id number]    Procedure Date: 07/21/2019 Time to register: 8:30 am Place to register: [facility] Procedure Time: 9:30 am Scheduled provider: Dr. Gala Romney  PREPARATION FOR COLONOSCOPY WITH TRI-LYTE SPLIT PREP  Please notify us immediately if you are diabetic, take iron supplements, or if you are on Coumadin or any other blood thinners.   Please hold the following medications: See letter  You will need to purchase 1 fleet enema and 1 box of Bisacodyl 67m tablets.   2 DAYS BEFORE PROCEDURE:  DATE: 07/19/2019   DAY: Wednesday Begin clear liquid diet AFTER your lunch meal. NO SOLID FOODS after this point.  1 DAY BEFORE PROCEDURE:  DATE: 07/20/2019   DAY: Thursday Continue clear liquids the entire day - NO SOLID FOOD.   Diabetic medications adjustments for today: See letter  At 2:00 pm:  Take 2 Bisacodyl tablets.   At 4:00pm:  Start drinking your solution. Make sure you mix well per instructions on the bottle. Try to drink 1 (one) 8 ounce glass every 10-15 minutes until you have consumed HALF the jug. You should complete by 6:00pm.You must keep the left over solution refrigerated until completed next day.  Continue clear liquids. You must drink plenty of clear liquids to prevent dehyration and kidney failure.     DAY OF PROCEDURE:   DATE: 07/21/2019   DAY: Friday If you take medications for your heart, blood pressure or breathing, you may take these medications.  Diabetic medications adjustments for today: See letter Five hours before your procedure time @ 4:30 am:  Finish remaining amout of bowel prep, drinking 1 (one) 8 ounce glass every 10-15 minutes until complete. You have two hours to consume remaining prep.   Three hours before your procedure time @ 6:30 am:  Nothing by mouth.   At least one hour before going to the hospital:  Give yourself one Fleet enema. You may take your morning medications with sip of water unless we have  instructed otherwise.      Please see below for Dietary Information.  CLEAR LIQUIDS INCLUDE:  Water Jello (NOT red in color)   Ice Popsicles (NOT red in color)   Tea (sugar ok, no milk/cream) Powdered fruit flavored drinks  Coffee (sugar ok, no milk/cream) Gatorade/ Lemonade/ Kool-Aid  (NOT red in color)   Juice: apple, white grape, white cranberry Soft drinks  Clear bullion, consomme, broth (fat free beef/chicken/vegetable)  Carbonated beverages (any kind)  Strained chicken noodle soup Hard Candy   Remember: Clear liquids are liquids that will allow you to see your fingers on the other side of a clear glass. Be sure liquids are NOT red in color, and not cloudy, but CLEAR.  DO NOT EAT OR DRINK ANY OF THE FOLLOWING:  Dairy products of any kind   Cranberry juice Tomato juice / V8 juice   Grapefruit juice Orange juice     Red grape juice  Do not eat any solid foods, including such foods as: cereal, oatmeal, yogurt, fruits, vegetables, creamed soups, eggs, bread, crackers, pureed foods in a blender, etc.   HELPFUL HINTS FOR DRINKING PREP SOLUTION:   Make sure prep is extremely cold. Mix and refrigerate the the morning of the prep. You may also put in the freezer.   You may try mixing some Crystal Light or Country Time Lemonade if you prefer. Mix in small amounts; add more if necessary.  Try drinking through a straw  Rinse mouth  with water or a mouthwash between glasses, to remove after-taste.  Try sipping on a cold beverage /ice/ popsicles between glasses of prep.  Place a piece of sugar-free hard candy in mouth between glasses.  If you become nauseated, try consuming smaller amounts, or stretch out the time between glasses. Stop for 30-60 minutes, then slowly start back drinking.        OTHER INSTRUCTIONS  You will need a responsible adult at least 55 years of age to accompany you and drive you home. This person must remain in the waiting room during your procedure. The  hospital will cancel your procedure if you do not have a responsible adult with you.   1. Wear loose fitting clothing that is easily removed. 2. Leave jewelry and other valuables at home.  3. Remove all body piercing jewelry and leave at home. 4. Total time from sign-in until discharge is approximately 2-3 hours. 5. You should go home directly after your procedure and rest. You can resume normal activities the day after your procedure. 6. The day of your procedure you should not:  Drive  Make legal decisions  Operate machinery  Drink alcohol  Return to work   You may call the office (Dept: [phone]) before 5:00pm, or page the doctor on call ([phone]) after 5:00pm, for further instructions, if necessary.   Insurance Information YOU WILL NEED TO CHECK WITH YOUR INSURANCE COMPANY FOR THE BENEFITS OF COVERAGE YOU HAVE FOR THIS PROCEDURE.  UNFORTUNATELY, NOT ALL INSURANCE COMPANIES HAVE BENEFITS TO COVER ALL OR PART OF THESE TYPES OF PROCEDURES.  IT IS YOUR RESPONSIBILITY TO CHECK YOUR BENEFITS, HOWEVER, WE WILL BE GLAD TO ASSIST YOU WITH ANY CODES YOUR INSURANCE COMPANY MAY NEED.    PLEASE NOTE THAT MOST INSURANCE COMPANIES WILL NOT COVER A SCREENING COLONOSCOPY FOR PEOPLE UNDER THE AGE OF 50  IF YOU HAVE BCBS INSURANCE, YOU MAY HAVE BENEFITS FOR A SCREENING COLONOSCOPY BUT IF POLYPS ARE FOUND THE DIAGNOSIS WILL CHANGE AND THEN YOU MAY HAVE A DEDUCTIBLE THAT WILL NEED TO BE MET. SO PLEASE MAKE SURE YOU CHECK YOUR BENEFITS FOR A SCREENING COLONOSCOPY AS WELL AS A DIAGNOSTIC COLONOSCOPY.  

## 2019-06-07 ENCOUNTER — Encounter: Payer: Self-pay | Admitting: *Deleted

## 2019-06-07 NOTE — Addendum Note (Signed)
Addended by: Metro Kung on: 06/07/2019 03:46 PM   Modules accepted: Orders, SmartSet

## 2019-06-07 NOTE — Progress Notes (Signed)
Ok to schedule.  DM meds: none morning of.  On prep day: Check CBG ac and hs as well (if they normally check their blood sugar) as if the patient feels like their blood sugar is off. Can use soda, juice (that's in [place]) as needed for any low blood sugar.  Check CBG on arrival to endo unit.

## 2019-06-15 DIAGNOSIS — J439 Emphysema, unspecified: Secondary | ICD-10-CM | POA: Diagnosis not present

## 2019-06-15 DIAGNOSIS — S27322A Contusion of lung, bilateral, initial encounter: Secondary | ICD-10-CM | POA: Diagnosis not present

## 2019-06-15 DIAGNOSIS — R05 Cough: Secondary | ICD-10-CM | POA: Diagnosis not present

## 2019-06-15 DIAGNOSIS — J9811 Atelectasis: Secondary | ICD-10-CM | POA: Diagnosis not present

## 2019-06-15 DIAGNOSIS — J432 Centrilobular emphysema: Secondary | ICD-10-CM | POA: Diagnosis not present

## 2019-06-15 DIAGNOSIS — Z1159 Encounter for screening for other viral diseases: Secondary | ICD-10-CM | POA: Diagnosis not present

## 2019-06-15 DIAGNOSIS — E669 Obesity, unspecified: Secondary | ICD-10-CM | POA: Diagnosis not present

## 2019-06-15 DIAGNOSIS — Y92413 State road as the place of occurrence of the external cause: Secondary | ICD-10-CM | POA: Diagnosis not present

## 2019-06-15 DIAGNOSIS — Z6834 Body mass index (BMI) 34.0-34.9, adult: Secondary | ICD-10-CM | POA: Diagnosis not present

## 2019-06-15 DIAGNOSIS — J942 Hemothorax: Secondary | ICD-10-CM | POA: Diagnosis not present

## 2019-06-15 DIAGNOSIS — J189 Pneumonia, unspecified organism: Secondary | ICD-10-CM | POA: Diagnosis not present

## 2019-06-15 DIAGNOSIS — S0990XA Unspecified injury of head, initial encounter: Secondary | ICD-10-CM | POA: Diagnosis not present

## 2019-06-15 DIAGNOSIS — E109 Type 1 diabetes mellitus without complications: Secondary | ICD-10-CM | POA: Diagnosis not present

## 2019-06-15 DIAGNOSIS — R0789 Other chest pain: Secondary | ICD-10-CM | POA: Diagnosis not present

## 2019-06-15 DIAGNOSIS — J90 Pleural effusion, not elsewhere classified: Secondary | ICD-10-CM | POA: Diagnosis not present

## 2019-06-15 DIAGNOSIS — S272XXA Traumatic hemopneumothorax, initial encounter: Secondary | ICD-10-CM | POA: Diagnosis not present

## 2019-06-15 DIAGNOSIS — R071 Chest pain on breathing: Secondary | ICD-10-CM | POA: Diagnosis not present

## 2019-06-15 DIAGNOSIS — S270XXA Traumatic pneumothorax, initial encounter: Secondary | ICD-10-CM | POA: Diagnosis not present

## 2019-06-15 DIAGNOSIS — S2239XA Fracture of one rib, unspecified side, initial encounter for closed fracture: Secondary | ICD-10-CM | POA: Diagnosis not present

## 2019-06-15 DIAGNOSIS — S2242XA Multiple fractures of ribs, left side, initial encounter for closed fracture: Secondary | ICD-10-CM | POA: Diagnosis not present

## 2019-06-15 DIAGNOSIS — J9601 Acute respiratory failure with hypoxia: Secondary | ICD-10-CM | POA: Diagnosis not present

## 2019-06-15 DIAGNOSIS — R109 Unspecified abdominal pain: Secondary | ICD-10-CM | POA: Diagnosis not present

## 2019-06-15 DIAGNOSIS — M25452 Effusion, left hip: Secondary | ICD-10-CM | POA: Diagnosis not present

## 2019-06-30 DIAGNOSIS — S27329A Contusion of lung, unspecified, initial encounter: Secondary | ICD-10-CM | POA: Diagnosis not present

## 2019-06-30 DIAGNOSIS — E782 Mixed hyperlipidemia: Secondary | ICD-10-CM | POA: Diagnosis not present

## 2019-06-30 DIAGNOSIS — E1165 Type 2 diabetes mellitus with hyperglycemia: Secondary | ICD-10-CM | POA: Diagnosis not present

## 2019-06-30 DIAGNOSIS — I10 Essential (primary) hypertension: Secondary | ICD-10-CM | POA: Diagnosis not present

## 2019-06-30 DIAGNOSIS — S2232XA Fracture of one rib, left side, initial encounter for closed fracture: Secondary | ICD-10-CM | POA: Diagnosis not present

## 2019-06-30 DIAGNOSIS — I5022 Chronic systolic (congestive) heart failure: Secondary | ICD-10-CM | POA: Diagnosis not present

## 2019-06-30 DIAGNOSIS — Z6830 Body mass index (BMI) 30.0-30.9, adult: Secondary | ICD-10-CM | POA: Diagnosis not present

## 2019-07-03 ENCOUNTER — Telehealth: Payer: Self-pay | Admitting: Internal Medicine

## 2019-07-03 NOTE — Telephone Encounter (Signed)
Routing to AS 
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
## 2019-07-03 NOTE — Telephone Encounter (Signed)
Sorry to hear that.  Rib fx's can be sore for weeks to months.  Would not necessarily delay procedure.  Lets check on him early in the week of Nov 2 and see how he is doing
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
## 2019-07-04 NOTE — Telephone Encounter (Addendum)
Spoke with pt today and informed him that we could check back around Nov. 2.  Pt said he wanted to keep his procedure and would let us know if anything changes.

## 2019-07-05 HISTORY — PX: LUNG SURGERY: SHX703

## 2019-07-19 ENCOUNTER — Other Ambulatory Visit (HOSPITAL_COMMUNITY)
Admission: RE | Admit: 2019-07-19 | Discharge: 2019-07-19 | Disposition: A | Discharge status: Home / Self Care | Payer: BC Managed Care – PPO | Source: Ambulatory Visit | Attending: Internal Medicine | Admitting: Internal Medicine

## 2019-07-19 ENCOUNTER — Other Ambulatory Visit: Payer: Self-pay

## 2019-07-19 DIAGNOSIS — Z20828 Contact with and (suspected) exposure to other viral communicable diseases: Secondary | ICD-10-CM | POA: Diagnosis not present

## 2019-07-19 DIAGNOSIS — Z01812 Encounter for preprocedural laboratory examination: Secondary | ICD-10-CM | POA: Insufficient documentation

## 2019-07-19 LAB — SARS CORONAVIRUS 2 (TAT 6-24 HRS): SARS Coronavirus 2: NEGATIVE

## 2019-07-21 ENCOUNTER — Ambulatory Visit (HOSPITAL_COMMUNITY)
Admission: RE | Admit: 2019-07-21 | Discharge: 2019-07-21 | Disposition: A | Discharge status: Home / Self Care | Payer: BC Managed Care – PPO | Attending: Internal Medicine | Admitting: Internal Medicine

## 2019-07-21 ENCOUNTER — Other Ambulatory Visit: Payer: Self-pay

## 2019-07-21 ENCOUNTER — Encounter (HOSPITAL_COMMUNITY): Payer: Self-pay | Admitting: *Deleted

## 2019-07-21 ENCOUNTER — Encounter (HOSPITAL_COMMUNITY)
Admission: RE | Disposition: A | Discharge status: Home / Self Care | Payer: Self-pay | Source: Home / Self Care | Attending: Internal Medicine

## 2019-07-21 DIAGNOSIS — I5022 Chronic systolic (congestive) heart failure: Secondary | ICD-10-CM | POA: Diagnosis not present

## 2019-07-21 DIAGNOSIS — Z87891 Personal history of nicotine dependence: Secondary | ICD-10-CM | POA: Diagnosis not present

## 2019-07-21 DIAGNOSIS — Z1211 Encounter for screening for malignant neoplasm of colon: Secondary | ICD-10-CM | POA: Diagnosis not present

## 2019-07-21 DIAGNOSIS — Z86010 Personal history of colonic polyps: Secondary | ICD-10-CM | POA: Diagnosis not present

## 2019-07-21 DIAGNOSIS — K635 Polyp of colon: Secondary | ICD-10-CM

## 2019-07-21 DIAGNOSIS — Z7984 Long term (current) use of oral hypoglycemic drugs: Secondary | ICD-10-CM | POA: Insufficient documentation

## 2019-07-21 DIAGNOSIS — Z7982 Long term (current) use of aspirin: Secondary | ICD-10-CM | POA: Diagnosis not present

## 2019-07-21 DIAGNOSIS — E119 Type 2 diabetes mellitus without complications: Secondary | ICD-10-CM | POA: Insufficient documentation

## 2019-07-21 DIAGNOSIS — I252 Old myocardial infarction: Secondary | ICD-10-CM | POA: Diagnosis not present

## 2019-07-21 DIAGNOSIS — I110 Hypertensive heart disease with heart failure: Secondary | ICD-10-CM | POA: Insufficient documentation

## 2019-07-21 DIAGNOSIS — K5730 Diverticulosis of large intestine without perforation or abscess without bleeding: Secondary | ICD-10-CM | POA: Diagnosis not present

## 2019-07-21 DIAGNOSIS — Z79899 Other long term (current) drug therapy: Secondary | ICD-10-CM | POA: Insufficient documentation

## 2019-07-21 HISTORY — PX: COLONOSCOPY: SHX5424

## 2019-07-21 HISTORY — PX: POLYPECTOMY: SHX5525

## 2019-07-21 LAB — GLUCOSE, CAPILLARY: Glucose-Capillary: 100 mg/dL — ABNORMAL HIGH (ref 70–99)

## 2019-07-21 SURGERY — COLONOSCOPY
Anesthesia: Moderate Sedation

## 2019-07-21 MED ORDER — ONDANSETRON HCL 4 MG/2ML IJ SOLN
INTRAMUSCULAR | Status: DC | PRN
  Administered 2019-07-21: 4 mg via INTRAVENOUS

## 2019-07-21 MED ORDER — MEPERIDINE HCL 100 MG/ML IJ SOLN
INTRAMUSCULAR | Status: DC | PRN
  Administered 2019-07-21: 25 mg via INTRAVENOUS
  Administered 2019-07-21: 15 mg via INTRAVENOUS
  Administered 2019-07-21: 10 mg via INTRAVENOUS

## 2019-07-21 MED ORDER — ONDANSETRON HCL 4 MG/2ML IJ SOLN
INTRAMUSCULAR | Status: AC
  Filled 2019-07-21: qty 2

## 2019-07-21 MED ORDER — MIDAZOLAM HCL 5 MG/5ML IJ SOLN
INTRAMUSCULAR | Status: AC
  Filled 2019-07-21: qty 10

## 2019-07-21 MED ORDER — SODIUM CHLORIDE 0.9 % IV SOLN
INTRAVENOUS | Status: DC
  Administered 2019-07-21: 1000 mL via INTRAVENOUS

## 2019-07-21 MED ORDER — STERILE WATER FOR IRRIGATION IR SOLN
Status: DC | PRN
  Administered 2019-07-21: 2.5 mL

## 2019-07-21 MED ORDER — MEPERIDINE HCL 50 MG/ML IJ SOLN
INTRAMUSCULAR | Status: AC
  Filled 2019-07-21: qty 1

## 2019-07-21 MED ORDER — MIDAZOLAM HCL 5 MG/5ML IJ SOLN
INTRAMUSCULAR | Status: DC | PRN
  Administered 2019-07-21 (×2): 2 mg via INTRAVENOUS
  Administered 2019-07-21: 1 mg via INTRAVENOUS

## 2019-07-21 NOTE — H&P (Signed)
@LOGO @   Primary Care Physician:  , MD Primary Gastroenterologist:  Dr. Juliette Alcide  Pre-Procedure History & Physical: HPI:  Travis Paul is a 53 y.o. male here for for surveillance colonoscopy.  History of sessile polyps removed elsewhere 2012.  Past Medical History:  Diagnosis Date  . Achilles tendinitis, right leg   . Chronic systolic heart failure (HCC)   . Diabetes mellitus without complication (HCC)   . Hyperlipidemia   . Hypertension   . Idiopathic gout, unspecified site   . Mallet finger of left hand   . Old myocardial infarction     Past Surgical History:  Procedure Laterality Date  . NO PAST SURGERIES      Prior to Admission medications   Medication Sig Start Date End Date Taking? Authorizing Provider  amLODipine (NORVASC) 10 MG tablet Take 10 mg by mouth daily.   Yes [provider]  fenofibrate 160 MG tablet Take 1 tablet (160 mg total) by mouth daily. 05/28/17  Yes 05/30/17, MD  metFORMIN (GLUCOPHAGE) 1000 MG tablet Take 1,000 mg by mouth daily with breakfast.    Yes [provider]  rosuvastatin (CRESTOR) 10 MG tablet Take 1 tablet (10 mg total) by mouth daily. 05/28/17  Yes 05/30/17, MD  aspirin EC 81 MG tablet Take 1 tablet (81 mg total) by mouth daily. 01/07/17   01/09/17, MD  benazepril (LOTENSIN) 40 MG tablet Take 40 mg by mouth daily.    [provider]  Chromium-Cinnamon (CINNAMON PLUS CHROMIUM) [phone] MCG-MG CAPS Take 1 capsule by mouth as needed.     [provider]  isosorbide mononitrate (IMDUR) 30 MG 24 hr tablet Take 1 tablet by mouth once daily Patient taking differently: Take 30 mg by mouth daily.  12/20/18   02/19/19, MD  Multiple Vitamin (MULTIVITAMIN WITH MINERALS) TABS tablet Take 1 tablet by mouth daily.    [provider]  nitroGLYCERIN (NITROSTAT) 0.4 MG SL tablet Place 1 tablet (0.4 mg total) under the tongue every 5 (five) minutes as  needed for chest pain. 01/07/17   01/09/17, MD    Allergies as of 06/07/2019  . (No Known Allergies)    Family History  Problem Relation Age of Onset  . Liver cancer Mother   . Colon polyps Paternal Uncle     Social History   Socioeconomic History  . Marital status: Single    Spouse name: Not on file  . Number of children: Not on file  . Years of education: Not on file  . Highest education level: Not on file  Occupational History  . Not on file  Social Needs  . Financial resource strain: Not on file  . Food insecurity    Worry: Not on file    Inability: Not on file  . Transportation needs    Medical: Not on file    Non-medical: Not on file  Tobacco Use  . Smoking status: Former Smoker    Packs/day: 1.00    Types: Cigarettes    Quit date: 2012    Years since quitting: 8.8  . Smokeless tobacco: Never Used  Substance and Sexual Activity  . Alcohol use: Yes    Comment: 1 beer on weekends  . Drug use: Never  . Sexual activity: Not on file  Lifestyle  . Physical activity    Days per week: Not on file    Minutes per session: Not on file  . Stress:  Not on file  Relationships  . Social Herbalist on phone: Not on file    Gets together: Not on file    Attends religious service: Not on file    Active member of club or organization: Not on file    Attends meetings of clubs or organizations: Not on file    Relationship status: Not on file  . Intimate partner violence    Fear of current or ex partner: Not on file    Emotionally abused: Not on file    Physically abused: Not on file    Forced sexual activity: Not on file  Other Topics Concern  . Not on file  Social History Narrative  . Not on file    Review of Systems: See HPI, otherwise negative ROS  Physical Exam: BP (!) 134/94   Pulse 66   Temp 98.1 F (36.7 C) (Oral)   Resp 11   Ht 5\' 11"  (1.803 m)   Wt 104.3 kg   SpO2 99%   BMI 32.08 kg/m  General:   Alert,  Well-developed,  well-nourished, pleasant and cooperative in NAD Skin:  Intact without significant lesions or rashes. Neck:  Supple; no masses or thyromegaly. No significant cervical adenopathy. Lungs:  Clear throughout to auscultation.   No wheezes, crackles, or rhonchi. No acute distress. Heart:  Regular rate and rhythm; no murmurs, clicks, rubs,  or gallops. Abdomen: Non-distended, normal bowel sounds.  Soft and nontender without appreciable mass or hepatosplenomegaly.  Pulses:  Normal pulses noted. Extremities:  Without clubbing or edema.  Impression/Plan: 53 year old gentleman history of colonic follow-ups removed elsewhere.  Here for surveillance colonoscopy per plan.  The risks, benefits, limitations, alternatives and imponderables have been reviewed with the patient. Questions have been answered. All parties are agreeable.      Notice: This dictation was prepared with Dragon dictation along with smaller phrase technology. Any transcriptional errors that result from this process are unintentional and may not be corrected upon review.

## 2019-07-21 NOTE — Discharge Instructions (Signed)
Colonoscopy Discharge Instructions  Read the instructions outlined below and refer to this sheet in the next few weeks. These discharge instructions provide you with general information on caring for yourself after you leave the hospital. Your doctor may also give you specific instructions. While your treatment has been planned according to the most current medical practices available, unavoidable complications occasionally occur. If you have any problems or questions after discharge, call Dr. Jena Gaussourk at [phone]. ACTIVITY  You may resume your regular activity, but move at a slower pace for the next 24 hours.   Take frequent rest periods for the next 24 hours.   Walking will help get rid of the air and reduce the bloated feeling in your belly (abdomen).   No driving for 24 hours (because of the medicine (anesthesia) used during the test).    Do not sign any important legal documents or operate any machinery for 24 hours (because of the anesthesia used during the test).  NUTRITION  Drink plenty of fluids.   You may resume your normal diet as instructed by your doctor.   Begin with a light meal and progress to your normal diet. Heavy or fried foods are harder to digest and may make you feel sick to your stomach (nauseated).   Avoid alcoholic beverages for 24 hours or as instructed.  MEDICATIONS  You may resume your normal medications unless your doctor tells you otherwise.  WHAT YOU CAN EXPECT TODAY  Some feelings of bloating in the abdomen.   Passage of more gas than usual.   Spotting of blood in your stool or on the toilet paper.  IF YOU HAD POLYPS REMOVED DURING THE COLONOSCOPY:  No aspirin products for 7 days or as instructed.   No alcohol for 7 days or as instructed.   Eat a soft diet for the next 24 hours.  FINDING OUT THE RESULTS OF YOUR TEST Not all test results are available during your visit. If your test results are not back during the visit, make an appointment  with your caregiver to find out the results. Do not assume everything is normal if you have not heard from your caregiver or the medical facility. It is important for you to follow up on all of your test results.  SEEK IMMEDIATE MEDICAL ATTENTION IF:  You have more than a spotting of blood in your stool.   Your belly is swollen (abdominal distention).   You are nauseated or vomiting.   You have a temperature over 101.   You have abdominal pain or discomfort that is severe or gets worse throughout the day.   Diverticulosis and colon polyp information provided  Further recommendations to follow pending review of pathology report  At patient request I called Jethro BolusLilly Allen at [phone] and reviewed results     Diverticulosis  Diverticulosis is a condition that develops when small pouches (diverticula) form in the wall of the large intestine (colon). The colon is where water is absorbed and stool is formed. The pouches form when the inside layer of the colon pushes through weak spots in the outer layers of the colon. You may have a few pouches or many of them. What are the causes? The cause of this condition is not known. What increases the risk? The following factors may make you more likely to develop this condition:  Being older than age 55. Your risk for this condition increases with age. Diverticulosis is rare among people younger than age 55. By age 680, many  people have it.  Eating a low-fiber diet.  Having frequent constipation.  Being overweight.  Not getting enough exercise.  Smoking.  Taking over-the-counter pain medicines, like aspirin and ibuprofen.  Having a family history of diverticulosis. What are the signs or symptoms? In most people, there are no symptoms of this condition. If you do have symptoms, they may include:  Bloating.  Cramps in the abdomen.  Constipation or diarrhea.  Pain in the lower left side of the abdomen. How is this diagnosed? This  condition is most often diagnosed during an exam for other colon problems. Because diverticulosis usually has no symptoms, it often cannot be diagnosed independently. This condition may be diagnosed by:  Using a flexible scope to examine the colon (colonoscopy).  Taking an X-ray of the colon after dye has been put into the colon (barium enema).  Doing a CT scan. How is this treated? You may not need treatment for this condition if you have never developed an infection related to diverticulosis. If you have had an infection before, treatment may include:  Eating a high-fiber diet. This may include eating more fruits, vegetables, and grains.  Taking a fiber supplement.  Taking a live bacteria supplement (probiotic).  Taking medicine to relax your colon.  Taking antibiotic medicines. Follow these instructions at home:  Drink 6-8 glasses of water or more each day to prevent constipation.  Try not to strain when you have a bowel movement.  If you have had an infection before: ? Eat more fiber as directed by your health care provider or your diet and nutrition specialist (dietitian). ? Take a fiber supplement or probiotic, if your health care provider approves.  Take over-the-counter and prescription medicines only as told by your health care provider.  If you were prescribed an antibiotic, take it as told by your health care provider. Do not stop taking the antibiotic even if you start to feel better.  Keep all follow-up visits as told by your health care provider. This is important. Contact a health care provider if:  You have pain in your abdomen.  You have bloating.  You have cramps.  You have not had a bowel movement in 3 days. Get help right away if:  Your pain gets worse.  Your bloating becomes very bad.  You have a fever or chills, and your symptoms suddenly get worse.  You vomit.  You have bowel movements that are bloody or black.  You have bleeding from your  rectum. Summary  Diverticulosis is a condition that develops when small pouches (diverticula) form in the wall of the large intestine (colon).  You may have a few pouches or many of them.  This condition is most often diagnosed during an exam for other colon problems.  If you have had an infection related to diverticulosis, treatment may include increasing the fiber in your diet, taking supplements, or taking medicines. This information is not intended to replace advice given to you by your health care provider. Make sure you discuss any questions you have with your health care provider. Document Released: 05/28/2004 Document Revised: 08/13/2017 Document Reviewed: 07/20/2016 Elsevier Patient Education  2020 [employer]    Colon Polyps  Polyps are tissue growths inside the body. Polyps can grow in many places, including the large intestine (colon). A polyp may be a round bump or a mushroom-shaped growth. You could have one polyp or several. Most colon polyps are noncancerous (benign). However, some colon polyps can become cancerous over time.  Finding and removing the polyps early can help prevent this. What are the causes? The exact cause of colon polyps is not known. What increases the risk? You are more likely to develop this condition if you:  Have a family history of colon cancer or colon polyps.  Are older than 71 or older than 45 if you are African American.  Have inflammatory bowel disease, such as ulcerative colitis or Crohn's disease.  Have certain hereditary conditions, such as: ? Familial adenomatous polyposis. ? Lynch syndrome. ? Turcot syndrome. ? Peutz-Jeghers syndrome.  Are overweight.  Smoke cigarettes.  Do not get enough exercise.  Drink too much alcohol.  Eat a diet that is high in fat and red meat and low in fiber.  Had childhood cancer that was treated with abdominal radiation. What are the signs or symptoms? Most polyps do not cause  symptoms. If you have symptoms, they may include:  Blood coming from your rectum when having a bowel movement.  Blood in your stool. The stool may look dark red or black.  Abdominal pain.  A change in bowel habits, such as constipation or diarrhea. How is this diagnosed? This condition is diagnosed with a colonoscopy. This is a procedure in which a lighted, flexible scope is inserted into the anus and then passed into the colon to examine the area. Polyps are sometimes found when a colonoscopy is done as part of routine cancer screening tests. How is this treated? Treatment for this condition involves removing any polyps that are found. Most polyps can be removed during a colonoscopy. Those polyps will then be tested for cancer. Additional treatment may be needed depending on the results of testing. Follow these instructions at home: Lifestyle  Maintain a healthy weight, or lose weight if recommended by your health care provider.  Exercise every day or as told by your health care provider.  Do not use any products that contain nicotine or tobacco, such as cigarettes and e-cigarettes. If you need help quitting, ask your health care provider.  If you drink alcohol, limit how much you have: ? 0-1 drink a day for women. ? 0-2 drinks a day for men.  Be aware of how much alcohol is in your drink. In the [place] one drink equals one 12 oz bottle of beer (355 mL), one 5 oz glass of wine (148 mL), or one 1 oz shot of hard liquor (44 mL). Eating and drinking   Eat foods that are high in fiber, such as fruits, vegetables, and whole grains.  Eat foods that are high in calcium and vitamin D, such as milk, cheese, yogurt, eggs, liver, fish, and broccoli.  Limit foods that are high in fat, such as fried foods and desserts.  Limit the amount of red meat and processed meat you eat, such as hot dogs, sausage, bacon, and lunch meats. General instructions  Keep all follow-up visits as told by your  health care provider. This is important. ? This includes having regularly scheduled colonoscopies. ? Talk to your health care provider about when you need a colonoscopy. Contact a health care provider if:  You have new or worsening bleeding during a bowel movement.  You have new or increased blood in your stool.  You have a change in bowel habits.  You lose weight for no known reason. Summary  Polyps are tissue growths inside the body. Polyps can grow in many places, including the colon.  Most colon polyps are noncancerous (benign), but some can become  cancerous over time.  This condition is diagnosed with a colonoscopy.  Treatment for this condition involves removing any polyps that are found. Most polyps can be removed during a colonoscopy. This information is not intended to replace advice given to you by your health care provider. Make sure you discuss any questions you have with your health care provider. Document Released: 05/27/2004 Document Revised: 12/16/2017 Document Reviewed: 12/16/2017 Elsevier Patient Education  2020 [employer].

## 2019-07-21 NOTE — Op Note (Signed)
[facility] Patient Name: Travis Paul Procedure Date: 07/21/2019 9:36 AM MRN: [id number] Date of Birth: 1965/09/25 Attending MD: Gennette Pac , MD CSN: [id number] Age: 53 Admit Type: Outpatient Procedure:                Colonoscopy Indications:              High risk colon cancer surveillance: Personal                            history of colonic polyps Providers:                Gennette Pac, MD, Loma Messing B. Patsy Lager, RN,                            Pandora Leiter, Technician Referring MD:              Medicines:                Midazolam 5 mg IV, Meperidine 50 mg IV Complications:            No immediate complications. Estimated Blood Loss:     Estimated blood loss was minimal. Procedure:                Pre-Anesthesia Assessment:                           - Prior to the procedure, a History and Physical                            was performed, and patient medications and                            allergies were reviewed. The patient's tolerance of                            previous anesthesia was also reviewed. The risks                            and benefits of the procedure and the sedation                            options and risks were discussed with the patient.                            All questions were answered, and informed consent                            was obtained. Prior Anticoagulants: The patient has                            taken no previous anticoagulant or antiplatelet                            agents. ASA Grade Assessment: II - A patient with  mild systemic disease. After reviewing the risks                            and benefits, the patient was deemed in                            satisfactory condition to undergo the procedure.                           After obtaining informed consent, the colonoscope                            was passed under direct vision. Throughout the      procedure, the patient's blood pressure, pulse, and                            oxygen saturations were monitored continuously. The                            CF-[id number] ([id number]) scope was introduced through                            the anus and advanced to the the cecum, identified                            by appendiceal orifice and ileocecal valve. The                            colonoscopy was performed without difficulty. The                            patient tolerated the procedure well. The quality                            of the bowel preparation was adequate. The                            colonoscopy was performed without difficulty. The                            patient tolerated the procedure well. The quality                            of the bowel preparation was adequate. The entire                            colon was well visualized. Scope In: 9:51:43 AM Scope Out: 10:07:58 AM Scope Withdrawal Time: 0 hours 12 minutes 20 seconds  Total Procedure Duration: 0 hours 16 minutes 15 seconds  Findings:      The perianal and digital rectal examinations were normal.      Scattered small and large-mouthed diverticula were found in the entire       colon.      A 5 mm polyp was found in the splenic  flexure. The polyp was removed       with a cold snare. Resection and retrieval were complete. Estimated       blood loss was minimal.      The exam was otherwise without abnormality on direct and retroflexion       views. Impression:               - Diverticulosis in the entire examined colon.                           - One 5 mm polyp at the splenic flexure, removed                            with a cold snare. Resected and retrieved.                           - The examination was otherwise normal on direct                            and retroflexion views. Moderate Sedation:      Moderate (conscious) sedation was administered by the endoscopy nurse       and supervised by  the endoscopist. The following parameters were       monitored: oxygen saturation, heart rate, blood pressure, respiratory       rate, EKG, adequacy of pulmonary ventilation, and response to care.       Total physician intraservice time was 23 minutes. Recommendation:           - Patient has a contact number available for                            emergencies. The signs and symptoms of potential                            delayed complications were discussed with the                            patient. Return to normal activities tomorrow.                            Written discharge instructions were provided to the                            patient.                           - Resume previous diet.                           - Continue present medications.                           - Repeat colonoscopy date to be determined after                            pending pathology results are reviewed for  surveillance.                           - Return to GI office (date not yet determined). Procedure Code(s):        --- Professional ---                           [phone], Colonoscopy, flexible; with removal of                            tumor(s), polyp(s), or other lesion(s) by snare                            technique                           99153, Moderate sedation; each additional 15                            minutes intraservice time                           G0500, Moderate sedation services provided by the                            same physician or other qualified health care                            professional performing a gastrointestinal                            endoscopic service that sedation supports,                            requiring the presence of an independent trained                            observer to assist in the monitoring of the                            patient's level of consciousness and physiological                             status; initial 15 minutes of intra-service time;                            patient age 74 years or older (additional time may                            be reported with [id number], as appropriate) Diagnosis Code(s):        --- Professional ---                           Z86.010, Personal history of colonic polyps  K63.5, Polyp of colon                           K57.30, Diverticulosis of large intestine without                            perforation or abscess without bleeding CPT copyright 2019 [employer]. All rights reserved. The codes documented in this report are preliminary and upon coder review may  be revised to meet current compliance requirements. Gerrit Friends. Rainie Crenshaw, MD Gennette Pac, MD 07/21/2019 10:17:41 AM This report has been signed electronically. Number of Addenda: 0

## 2019-07-24 LAB — SURGICAL PATHOLOGY

## 2019-07-25 ENCOUNTER — Encounter: Payer: Self-pay | Admitting: Internal Medicine

## 2019-07-26 ENCOUNTER — Encounter (HOSPITAL_COMMUNITY): Payer: Self-pay | Admitting: Internal Medicine

## 2019-07-26 DIAGNOSIS — S2232XA Fracture of one rib, left side, initial encounter for closed fracture: Secondary | ICD-10-CM | POA: Diagnosis not present

## 2019-07-26 DIAGNOSIS — E1165 Type 2 diabetes mellitus with hyperglycemia: Secondary | ICD-10-CM | POA: Diagnosis not present

## 2019-07-26 DIAGNOSIS — Z1389 Encounter for screening for other disorder: Secondary | ICD-10-CM | POA: Diagnosis not present

## 2019-07-26 DIAGNOSIS — S27329A Contusion of lung, unspecified, initial encounter: Secondary | ICD-10-CM | POA: Diagnosis not present

## 2019-07-26 DIAGNOSIS — Z1331 Encounter for screening for depression: Secondary | ICD-10-CM | POA: Diagnosis not present

## 2019-08-17 DIAGNOSIS — Z6830 Body mass index (BMI) 30.0-30.9, adult: Secondary | ICD-10-CM | POA: Diagnosis not present

## 2019-08-17 DIAGNOSIS — N410 Acute prostatitis: Secondary | ICD-10-CM | POA: Diagnosis not present

## 2019-09-20 ENCOUNTER — Telehealth: Payer: Self-pay | Admitting: Cardiovascular Disease

## 2019-09-20 NOTE — Telephone Encounter (Signed)
Virtual Visit Pre-Appointment Phone Call  "(Name), I am calling you today to discuss your upcoming appointment. We are currently trying to limit exposure to the virus that causes COVID-19 by seeing patients at home rather than in the office."  1. "What is the BEST phone number to call the day of the visit?" - include this in appointment notes  2. "Do you have or have access to (through a family member/friend) a smartphone with video capability that we can use for your visit?" a. If yes - list this number in appt notes as "cell" (if different from BEST phone #) and list the appointment type as a VIDEO visit in appointment notes b. If no - list the appointment type as a PHONE visit in appointment notes  3. Confirm consent - "In the setting of the current Covid19 crisis, you are scheduled for a (phone or video) visit with your provider on (date) at (time).  Just as we do with many in-office visits, in order for you to participate in this visit, we must obtain consent.  If you'd like, I can send this to your mychart (if signed up) or email for you to review.  Otherwise, I can obtain your verbal consent now.  All virtual visits are billed to your insurance company just like a normal visit would be.  By agreeing to a virtual visit, we'd like you to understand that the technology does not allow for your provider to perform an examination, and thus may limit your provider's ability to fully assess your condition. If your provider identifies any concerns that need to be evaluated in person, we will make arrangements to do so.  Finally, though the technology is pretty good, we cannot assure that it will always work on either your or our end, and in the setting of a video visit, we may have to convert it to a phone-only visit.  In either situation, we cannot ensure that we have a secure connection.  Are you willing to proceed?" STAFF: Did the patient verbally acknowledge consent to telehealth visit? Document  YES/NO here: yes  4. Advise patient to be prepared - "Two hours prior to your appointment, go ahead and check your blood pressure, pulse, oxygen saturation, and your weight (if you have the equipment to check those) and write them all down. When your visit starts, your provider will ask you for this information. If you have an Apple Watch or Kardia device, please plan to have heart rate information ready on the day of your appointment. Please have a pen and paper handy nearby the day of the visit as well."  5. Give patient instructions for MyChart download to smartphone OR Doximity/Doxy.me as below if video visit (depending on what platform provider is using)  6. Inform patient they will receive a phone call 15 minutes prior to their appointment time (may be from unknown caller ID) so they should be prepared to answer    TELEPHONE CALL NOTE  AJENE CARCHI has been deemed a candidate for a follow-up tele-health visit to limit community exposure during the Covid-19 pandemic. I spoke with the patient via phone to ensure availability of phone/video source, confirm preferred email & phone number, and discuss instructions and expectations.  I reminded Fredy Gladu Sciarra to be prepared with any vital sign and/or heart rhythm information that could potentially be obtained via home monitoring, at the time of his visit. I reminded Kyrin Gratz Goughnour to expect a phone call prior to  his visit.  Geraldine Contras 09/20/2019 1:29 PM   INSTRUCTIONS FOR DOWNLOADING THE MYCHART APP TO SMARTPHONE  - The patient must first make sure to have activated MyChart and know their login information - If Apple, go to [employer] and type in MyChart in the search bar and download the app. If Android, ask patient to go to [employer] and type in [place] in the search bar and download the app. The app is free but as with any other app downloads, their phone may require them to verify saved payment information or  Apple/Android password.  - The patient will need to then log into the app with their MyChart username and password, and select [facility] as their healthcare provider to link the account. When it is time for your visit, go to the MyChart app, find appointments, and click Begin Video Visit. Be sure to Select Allow for your device to access the Microphone and Camera for your visit. You will then be connected, and your provider will be with you shortly.  **If they have any issues connecting, or need assistance please contact MyChart service desk [phone])**  **If using a computer, in order to ensure the best quality for their visit they will need to use either of the following Internet Browsers: [employer] or Google Chrome**  IF USING DOXIMITY or DOXY.ME - The patient will receive a link just prior to their visit by text.     FULL LENGTH CONSENT FOR TELE-HEALTH VISIT   I hereby voluntarily request, consent and authorize [facility] and its employed or contracted physicians, physician assistants, nurse practitioners or other licensed health care professionals (the Practitioner), to provide me with telemedicine health care services (the "Services") as deemed necessary by the treating Practitioner. I acknowledge and consent to receive the Services by the Practitioner via telemedicine. I understand that the telemedicine visit will involve communicating with the Practitioner through live audiovisual communication technology and the disclosure of certain medical information by electronic transmission. I acknowledge that I have been given the opportunity to request an in-person assessment or other available alternative prior to the telemedicine visit and am voluntarily participating in the telemedicine visit.  I understand that I have the right to withhold or withdraw my consent to the use of telemedicine in the course of my care at any time, without affecting my right to future care  or treatment, and that the Practitioner or I may terminate the telemedicine visit at any time. I understand that I have the right to inspect all information obtained and/or recorded in the course of the telemedicine visit and may receive copies of available information for a reasonable fee.  I understand that some of the potential risks of receiving the Services via telemedicine include:  Marland Kitchen Delay or interruption in medical evaluation due to technological equipment failure or disruption; . Information transmitted may not be sufficient (e.g. poor resolution of images) to allow for appropriate medical decision making by the Practitioner; and/or  . In rare instances, security protocols could fail, causing a breach of personal health information.  Furthermore, I acknowledge that it is my responsibility to provide information about my medical history, conditions and care that is complete and accurate to the best of my ability. I acknowledge that Practitioner's advice, recommendations, and/or decision may be based on factors not within their control, such as incomplete or inaccurate data provided by me or distortions of diagnostic images or specimens that may result from electronic transmissions. I  understand that the practice of medicine is not an exact science and that Practitioner makes no warranties or guarantees regarding treatment outcomes. I acknowledge that I will receive a copy of this consent concurrently upon execution via email to the email address I last provided but may also request a printed copy by calling the office of [facility].    I understand that my insurance will be billed for this visit.   I have read or had this consent read to me. . I understand the contents of this consent, which adequately explains the benefits and risks of the Services being provided via telemedicine.  . I have been provided ample opportunity to ask questions regarding this consent and the Services and have had  my questions answered to my satisfaction. . I give my informed consent for the services to be provided through the use of telemedicine in my medical care  By participating in this telemedicine visit I agree to the above.

## 2019-09-21 ENCOUNTER — Telehealth: Payer: Self-pay | Admitting: Cardiovascular Disease

## 2019-10-24 ENCOUNTER — Telehealth: Payer: Self-pay | Admitting: *Deleted

## 2019-10-24 NOTE — Telephone Encounter (Signed)
The patient verbally consented for a telehealth phone visit with [facility] and understands that his/her insurance company will be billed for the encounter.  

## 2019-10-27 ENCOUNTER — Telehealth (INDEPENDENT_AMBULATORY_CARE_PROVIDER_SITE_OTHER): Payer: 59 | Admitting: Cardiovascular Disease

## 2019-10-27 ENCOUNTER — Encounter: Payer: Self-pay | Admitting: Cardiovascular Disease

## 2019-10-27 VITALS — Ht 73.0 in | Wt 230.0 lb

## 2019-10-27 DIAGNOSIS — I10 Essential (primary) hypertension: Secondary | ICD-10-CM

## 2019-10-27 DIAGNOSIS — I25118 Atherosclerotic heart disease of native coronary artery with other forms of angina pectoris: Secondary | ICD-10-CM | POA: Diagnosis not present

## 2019-10-27 DIAGNOSIS — E785 Hyperlipidemia, unspecified: Secondary | ICD-10-CM

## 2019-10-27 NOTE — Patient Instructions (Signed)
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
## 2020-02-12 ENCOUNTER — Other Ambulatory Visit: Payer: Self-pay | Admitting: Cardiovascular Disease

## 2020-04-04 ENCOUNTER — Encounter: Payer: Self-pay | Admitting: *Deleted

## 2020-04-04 NOTE — Progress Notes (Signed)
Walked into office requesting an appointment for a stress test. Reports being seen by his PCP today who suggested he contact our office for a stress test. Reports aching, right side chest pain going into right arm pit pain rated 3/10 and SOB for the past 2 months. Denies dizziness or SOB. Says he has not used nitroglycerin. Also says he forgot to report to his PCP today that he's had a numbness and tingling feeling on the left side of his face and left arm. Advised to contact his PCP with these symptoms. Appointment arranged with NP for 04/09/20 @3 :00 pm. Records requested from PCP. Advised if symptoms got worse, to go to the ED for an evaluation. Verbalized understanding of plan.

## 2020-04-09 ENCOUNTER — Ambulatory Visit: Payer: 59 | Admitting: Family Medicine
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
## 2020-06-14 ENCOUNTER — Encounter: Payer: Self-pay | Admitting: *Deleted

## 2020-06-14 ENCOUNTER — Ambulatory Visit (INDEPENDENT_AMBULATORY_CARE_PROVIDER_SITE_OTHER): Payer: 59 | Admitting: Family Medicine

## 2020-06-14 ENCOUNTER — Encounter: Payer: Self-pay | Admitting: Family Medicine

## 2020-06-14 ENCOUNTER — Telehealth: Payer: Self-pay | Admitting: Family Medicine

## 2020-06-14 VITALS — BP 136/86 | HR 76 | Ht 73.0 in | Wt 238.2 lb

## 2020-06-14 DIAGNOSIS — E782 Mixed hyperlipidemia: Secondary | ICD-10-CM

## 2020-06-14 DIAGNOSIS — I10 Essential (primary) hypertension: Secondary | ICD-10-CM

## 2020-06-14 DIAGNOSIS — R079 Chest pain, unspecified: Secondary | ICD-10-CM | POA: Diagnosis not present

## 2020-06-14 DIAGNOSIS — E119 Type 2 diabetes mellitus without complications: Secondary | ICD-10-CM

## 2020-06-14 DIAGNOSIS — R0602 Shortness of breath: Secondary | ICD-10-CM

## 2020-06-14 DIAGNOSIS — J432 Centrilobular emphysema: Secondary | ICD-10-CM

## 2020-06-14 NOTE — Patient Instructions (Addendum)
Medication Instructions:   Your physician recommends that you continue on your current medications as directed. Please refer to the Current Medication list given to you today.  Labwork:  None  Testing/Procedures: Your physician has requested that you have an echocardiogram. Echocardiography is a painless test that uses sound waves to create images of your heart. It provides your doctor with information about the size and shape of your heart and how well your heart's chambers and valves are working. This procedure takes approximately one hour. There are no restrictions for this procedure. Your physician has requested that you have a lexiscan myoview. For further information please visit [email or web address] Please follow instruction sheet, as given.  Follow-Up:  Your physician recommends that you schedule a follow-up appointment in: 4-6 weeks.  Any Other Special Instructions Will Be Listed Below (If Applicable).  If you need a refill on your cardiac medications before your next appointment, please call your pharmacy.

## 2020-06-14 NOTE — Telephone Encounter (Signed)
Pre-cert Verification for the following procedure    LEXISCAN MYOVIEW - scheduled 06-21-2020 at [facility]  ECHO - scheduled 06-25-2020 at [facility]

## 2020-06-21 ENCOUNTER — Encounter (HOSPITAL_COMMUNITY): Payer: Self-pay

## 2020-06-21 ENCOUNTER — Other Ambulatory Visit: Payer: Self-pay

## 2020-06-21 ENCOUNTER — Encounter (HOSPITAL_COMMUNITY)
Admission: RE | Admit: 2020-06-21 | Discharge: 2020-06-21 | Disposition: A | Discharge status: Home / Self Care | Payer: 59 | Source: Ambulatory Visit | Attending: Family Medicine | Admitting: Family Medicine

## 2020-06-21 ENCOUNTER — Ambulatory Visit (HOSPITAL_COMMUNITY)
Admission: RE | Admit: 2020-06-21 | Discharge: 2020-06-21 | Disposition: A | Discharge status: Home / Self Care | Payer: 59 | Source: Ambulatory Visit | Attending: Family Medicine | Admitting: Family Medicine

## 2020-06-21 DIAGNOSIS — R0602 Shortness of breath: Secondary | ICD-10-CM | POA: Diagnosis present

## 2020-06-21 DIAGNOSIS — R079 Chest pain, unspecified: Secondary | ICD-10-CM | POA: Diagnosis not present

## 2020-06-21 LAB — NM MYOCAR MULTI W/SPECT W/WALL MOTION / EF
LV dias vol: 104 mL (ref 62–150)
LV sys vol: 39 mL
Peak HR: 87 {beats}/min
RATE: 0.32
Rest HR: 57 {beats}/min
SDS: 0
SRS: 6
SSS: 6
TID: 1.09

## 2020-06-21 MED ORDER — TECHNETIUM TC 99M TETROFOSMIN IV KIT
10.0000 | PACK | Freq: Once | INTRAVENOUS | Status: AC | PRN
  Administered 2020-06-21: 11 via INTRAVENOUS

## 2020-06-21 MED ORDER — SODIUM CHLORIDE FLUSH 0.9 % IV SOLN
INTRAVENOUS | Status: AC
  Administered 2020-06-21: 10 mL via INTRAVENOUS
  Filled 2020-06-21: qty 10

## 2020-06-21 MED ORDER — TECHNETIUM TC 99M TETROFOSMIN IV KIT
30.0000 | PACK | Freq: Once | INTRAVENOUS | Status: AC | PRN
  Administered 2020-06-21: 30 via INTRAVENOUS

## 2020-06-21 MED ORDER — REGADENOSON 0.4 MG/5ML IV SOLN
INTRAVENOUS | Status: AC
  Administered 2020-06-21: 0.4 mg via INTRAVENOUS
  Filled 2020-06-21: qty 5

## 2020-06-24 ENCOUNTER — Telehealth: Payer: Self-pay | Admitting: *Deleted

## 2020-06-24 NOTE — Telephone Encounter (Signed)
Lesle Chris, LPN  96/29/5284 4:27 PM EDT Back to Top    Notified, copy to pcp.

## 2020-06-24 NOTE — Telephone Encounter (Signed)
-----   Message from Netta Neat., NP sent at 06/21/2020  5:26 PM EDT ----- Please call the patient let him know the stress test did not show any evidence of lack of blood flow through the coronary arteries in his heart.  This was considered a low risk study.  Thank you

## 2020-06-25 ENCOUNTER — Ambulatory Visit (INDEPENDENT_AMBULATORY_CARE_PROVIDER_SITE_OTHER): Payer: 59

## 2020-06-25 DIAGNOSIS — R0602 Shortness of breath: Secondary | ICD-10-CM

## 2020-06-25 LAB — ECHOCARDIOGRAM COMPLETE
AR max vel: 3.12 cm2
AV Area VTI: 3.03 cm2
AV Area mean vel: 2.97 cm2
AV Mean grad: 10.5 mmHg
AV Peak grad: 17.1 mmHg
Ao pk vel: 2.07 m/s
Area-P 1/2: 1.97 cm2
Calc EF: 84.8 %
MV M vel: 1.8 m/s
MV Peak grad: 13 mmHg
S' Lateral: 2.67 cm
Single Plane A2C EF: 84.4 %
Single Plane A4C EF: 84.9 %

## 2020-06-28 ENCOUNTER — Ambulatory Visit: Payer: 59 | Admitting: Family Medicine

## 2020-07-01 ENCOUNTER — Telehealth: Payer: Self-pay | Admitting: *Deleted

## 2020-07-01 NOTE — Telephone Encounter (Signed)
Lesle Chris, LPN  19/80/2217 5:18 PM EDT Back to Top    Notified, copy to pcp. States that he will be OOT and not be back home until February. He will try to schedule his follow up when he can work it out to get back to [place].

## 2020-07-01 NOTE — Telephone Encounter (Signed)
-----   Message from Netta Neat., NP sent at 06/26/2020 11:36 AM EDT ----- Please call the patient  and let him know the echocardiogram showed good pumping function of the heart. . He has grade 1 diastolic dysfunction meaning that the main pumping chamber is a little stiff and has problems relaxing. Managing BP to keep it at or below 130/80 or below will help. This is not likely the cause of his SOB. He has no leaking valves.

## 2020-09-16 ENCOUNTER — Other Ambulatory Visit: Payer: Self-pay

## 2020-09-16 MED ORDER — ISOSORBIDE MONONITRATE ER 30 MG PO TB24: 30.0000 mg | ORAL_TABLET | Freq: Every day | ORAL | 1 refills | Status: DC

## 2020-11-11 ENCOUNTER — Other Ambulatory Visit: Payer: Self-pay | Admitting: Family Medicine

## 2020-11-11 ENCOUNTER — Other Ambulatory Visit (HOSPITAL_COMMUNITY): Payer: Self-pay | Admitting: Family Medicine

## 2020-11-11 DIAGNOSIS — Z87891 Personal history of nicotine dependence: Secondary | ICD-10-CM

## 2020-12-05 ENCOUNTER — Encounter (HOSPITAL_COMMUNITY): Payer: Self-pay

## 2020-12-05 ENCOUNTER — Ambulatory Visit (HOSPITAL_COMMUNITY): Admission: RE | Admit: 2020-12-05 | Payer: 59 | Source: Ambulatory Visit

## 2021-01-06 ENCOUNTER — Ambulatory Visit (HOSPITAL_COMMUNITY): Payer: 59

## 2021-01-28 ENCOUNTER — Ambulatory Visit (HOSPITAL_COMMUNITY): Admission: RE | Admit: 2021-01-28 | Payer: 59 | Source: Ambulatory Visit

## 2021-02-28 ENCOUNTER — Other Ambulatory Visit: Payer: Self-pay | Admitting: Family Medicine

## 2021-02-28 ENCOUNTER — Other Ambulatory Visit (HOSPITAL_COMMUNITY): Payer: Self-pay | Admitting: Family Medicine

## 2021-02-28 DIAGNOSIS — S0081XA Abrasion of other part of head, initial encounter: Secondary | ICD-10-CM

## 2021-03-03 ENCOUNTER — Other Ambulatory Visit: Payer: Self-pay

## 2021-03-03 ENCOUNTER — Ambulatory Visit (HOSPITAL_COMMUNITY)
Admission: RE | Admit: 2021-03-03 | Discharge: 2021-03-03 | Disposition: A | Discharge status: Home / Self Care | Payer: 59 | Source: Ambulatory Visit | Attending: Family Medicine | Admitting: Family Medicine

## 2021-03-03 DIAGNOSIS — Y939 Activity, unspecified: Secondary | ICD-10-CM | POA: Diagnosis not present

## 2021-03-03 DIAGNOSIS — Y929 Unspecified place or not applicable: Secondary | ICD-10-CM | POA: Insufficient documentation

## 2021-03-03 DIAGNOSIS — S0081XA Abrasion of other part of head, initial encounter: Secondary | ICD-10-CM

## 2021-04-13 ENCOUNTER — Other Ambulatory Visit: Payer: Self-pay | Admitting: Family Medicine

## 2021-09-10 ENCOUNTER — Telehealth: Payer: Self-pay | Admitting: *Deleted

## 2021-09-10 NOTE — Telephone Encounter (Signed)
Fax from [facility] - request refill for Isosorbide ER 30mg  daily.    Last seen 06/14/2020 by 08/14/2020, NP - prev SK.

## 2021-09-11 MED ORDER — ISOSORBIDE MONONITRATE ER 30 MG PO TB24: 30.0000 mg | ORAL_TABLET | Freq: Every day | ORAL | 1 refills | Status: DC

## 2021-09-11 NOTE — Telephone Encounter (Signed)
Patient notified and verbalized understanding.  OV scheduled for January with Dr. Shari Prows in [place].  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

## 2021-10-11 NOTE — Progress Notes (Signed)
Cardiology Office Note:    Date:  10/13/2021   ID:  Travis Paul, DOB 1966/06/06, MRN [id number]  PCP:  Curlene Labrum, MD   [facility] Providers Cardiologist:  None {    Referring MD: Curlene Labrum, MD     History of Present Illness:    Travis Paul is a 56 y.o. male with a hx of CAD, HTN, HLD who was previously followed by Dr. Bronson Ing who now returns to clinic for follow-up.  Per review of the record, patient had nuclear stress test [facility] 01/04/2017 showed prior inferior wall infarct with no evidence of ischemia, there was inferior wall hypokinesis, LVEF 49%. Echo on 02/10/2017 demonstrated vigorous left ventricular systolic function, LVEF 70 to 75%, mild LVH, G1 DD.  Was last seen by Levell July on 06/2020 with continued mild chest pain that was constant in nature. Myoview was obtained and was low risk with no ischemia.  Repeat TTE in 06/2020 with EF >75%, G1DD, normal RV, no significant valve disease.  Today, the patient states that he continues to have intermittent chest pain. Specifically, he feels like there is a knot in his chest that can take several days to resolve. Symptoms are not worse with exertion but be made worse with certain arm positions/movements. Nothing seems to make the symptoms better and they eventually resolve on their own. No associated symptoms at that time.  Otherwise, he reports occasional SOB with exertion, which he attributes to his prior heavy tobacco use. No lightheadedness, dizziness or syncope. Blood pressures not monitored at home. Complaint with all medications.   Past Medical History:  Diagnosis Date   Achilles tendinitis, right leg    Chronic systolic heart failure (HCC)    Diabetes mellitus without complication (HCC)    Hyperlipidemia    Hypertension    Idiopathic gout, unspecified site    Mallet finger of left hand    Old myocardial infarction     Past Surgical History:  Procedure Laterality Date    COLONOSCOPY N/A 07/21/2019   Procedure: COLONOSCOPY;  Surgeon: Daneil Dolin, MD;  [facility];  Service: Endoscopy;  Laterality: N/A;  9:30   NO PAST SURGERIES     POLYPECTOMY  07/21/2019   Procedure: POLYPECTOMY;  Surgeon: Daneil Dolin, MD;  [facility];  Service: Endoscopy;;  colon    Current Medications: Current Meds  Medication Sig   aspirin EC 81 MG tablet Take 1 tablet (81 mg total) by mouth daily.   Biotin w/ Vitamins C & E (HAIR/SKIN/NAILS PO) Take 1 tablet by mouth daily.   metFORMIN (GLUCOPHAGE) 1000 MG tablet Take 1,000 mg by mouth 2 (two) times daily with a meal.    Multiple Vitamin (MULTIVITAMIN WITH MINERALS) TABS tablet Take 1 tablet by mouth daily.   nitroGLYCERIN (NITROSTAT) 0.4 MG SL tablet Place 1 tablet (0.4 mg total) under the tongue every 5 (five) minutes as needed for chest pain.   NON FORMULARY Take 1 tablet by mouth 2 (two) times daily. NUGENIX Total-T MAXX   omeprazole (PRILOSEC) 40 MG capsule Take 40 mg by mouth daily.   zinc gluconate 50 MG tablet Take 50 mg by mouth daily.   [DISCONTINUED] amLODipine (NORVASC) 10 MG tablet Take 10 mg by mouth daily.   [DISCONTINUED] benazepril (LOTENSIN) 40 MG tablet Take 40 mg by mouth daily.   [DISCONTINUED] fenofibrate 160 MG tablet Take 1 tablet (160 mg total) by mouth daily.   [DISCONTINUED] isosorbide mononitrate (IMDUR)  30 MG 24 hr tablet Take 1 tablet (30 mg total) by mouth daily.   [DISCONTINUED] rosuvastatin (CRESTOR) 10 MG tablet Take 1 tablet (10 mg total) by mouth daily.     Allergies:   Patient has no known allergies.   Social History   Socioeconomic History   Marital status: Single    Spouse name: Not on file   Number of children: Not on file   Years of education: Not on file   Highest education level: Not on file  Occupational History   Not on file  Tobacco Use   Smoking status: Former    Packs/day: 1.00    Types: Cigarettes    Quit date: 2012    Years since quitting:  11.0   Smokeless tobacco: Never  Vaping Use   Vaping Use: Never used  Substance and Sexual Activity   Alcohol use: Yes    Comment: 1 beer on weekends   Drug use: Never   Sexual activity: Not on file  Other Topics Concern   Not on file  Social History Narrative   Not on file   Social Determinants of Health   Financial Resource Strain: Not on file  Food Insecurity: Not on file  Transportation Needs: Not on file  Physical Activity: Not on file  Stress: Not on file  Social Connections: Not on file     Family History: The patient's family history includes Colon polyps in his paternal uncle; Liver cancer in his mother.  ROS:   Please see the history of present illness.    All other systems reviewed and are negative.  EKGs/Labs/Other Studies Reviewed:    The following studies were reviewed today: TTE July 03, 2020: IMPRESSIONS     1. Left ventricular ejection fraction, by estimation, is >75%. The left  ventricle has hyperdynamic function. The left ventricle has no regional  wall motion abnormalities. Left ventricular diastolic parameters are  consistent with Grade I diastolic  dysfunction (impaired relaxation).   2. Right ventricular systolic function is normal. The right ventricular  size is normal.   3. The mitral valve is normal in structure. No evidence of mitral valve  regurgitation. No evidence of mitral stenosis.   4. The aortic valve is tricuspid. Aortic valve regurgitation is not  visualized. No aortic stenosis is present.   5. The inferior vena cava is normal in size with greater than 50%  respiratory variability, suggesting right atrial pressure of 3 mmHg.   Comparison(s): Echocardiogram done 01/13/17 showed an EF of 70%.  Myoview 07/03/20: No diagnostic ST segment changes to indicate ischemia. Small, moderate intensity, fixed inferior defect most prominent on rest imaging and in the mid to apical region. Gated imaging indicates normal wall motion suggesting soft  tissue attenuation, although scar cannot be completely excluded. No definite ischemia. This is a low risk study. Nuclear stress EF: 62%.  EKG:  EKG is  ordered today.  The ekg ordered today demonstrates NSR  Recent Labs: No results found for requested labs within last 8760 hours.  Recent Lipid Panel No results found for: CHOL, TRIG, HDL, CHOLHDL, VLDL, LDLCALC, LDLDIRECT   Risk Assessment/Calculations:           Physical Exam:    VS:  BP 140/90    Pulse 79    Ht 6' (1.829 m)    Wt 234 lb 8 oz (106.4 kg)    SpO2 96%    BMI 31.80 kg/m     Wt Readings from Last 3 Encounters:  10/13/21 234 lb 8 oz (106.4 kg)  06/14/20 238 lb 3.2 oz (108 kg)  10/27/19 230 lb (104.3 kg)     GEN:  Well nourished, well developed in no acute distress HEENT: Normal NECK: No JVD; No carotid bruits CARDIAC: RRR, no murmurs, rubs, gallops RESPIRATORY:  Clear to auscultation without rales, wheezing or rhonchi  ABDOMEN: Soft, non-tender, non-distended MUSCULOSKELETAL:  No edema; No deformity  SKIN: Warm and dry NEUROLOGIC:  Alert and oriented x 3 PSYCHIATRIC:  Normal affect   ASSESSMENT:    1. Chest pain, unspecified type   2. Essential hypertension   3. Type 2 diabetes mellitus without complication, without long-term current use of insulin ([place])   4. Mixed hyperlipidemia   5. Centrilobular emphysema ([place])    PLAN:    In order of problems listed above:  #Chest Pain: Cardiac work-up reassuring with no ischemia on nuc in 06/2020 and normal EF on TTE. Suspect symptoms are MSK in nature given that they get worse with certain arm positions and last for several days at a time. 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 Will follow-up with PCP for further management. -Reassuring CV work-up -Follow-up with PCP   #HTN: -Continue amlodipine 10mg  daily -Continue benzapril 40mg  daily -Continue imdur 30mg  daily -Monitor blood pressures at home and bring in log for [place] to  review  #HLD: -Continue crestor 10mg  daily -Continue fenofibrate 160mg  daily -Plan for lipids at PCP visit next week and will send to [place] to review  #DMII: -On metformin, follow-up with PCP as schedule  #COPD: #Prior heavy tobacco use: -Follow-up with PCP -Planned for screening CT      Medication Adjustments/Labs and Tests Ordered: Current medicines are reviewed at length with the patient today.  Concerns regarding medicines are outlined above.  Orders Placed This Encounter  Procedures   EKG 12-Lead   Meds ordered this encounter  Medications   amLODipine (NORVASC) 10 MG tablet    Sig: Take 1 tablet (10 mg total) by mouth daily.    Dispense:  90 tablet    Refill:  3   benazepril (LOTENSIN) 40 MG tablet    Sig: Take 1 tablet (40 mg total) by mouth daily.    Dispense:  90 tablet    Refill:  3   fenofibrate 160 MG tablet    Sig: Take 1 tablet (160 mg total) by mouth daily.    Dispense:  90 tablet    Refill:  3   isosorbide mononitrate (IMDUR) 30 MG 24 hr tablet    Sig: Take 1 tablet (30 mg total) by mouth daily.    Dispense:  30 tablet    Refill:  1   rosuvastatin (CRESTOR) 10 MG tablet    Sig: Take 1 tablet (10 mg total) by mouth daily.    Dispense:  90 tablet    Refill:  3    Dose increased by PMD    Patient Instructions  Follow-Up: Follow up with Dr. Johney Frame in 6 months  If you need a refill on your cardiac medications before your next appointment, please call your pharmacy.  Gwyndolyn Kaufman, MD [facility] [address] [place], [address] [phone] (F)    Signed, Freada Bergeron, MD  10/13/2021 3:03 PM    Manuel Garcia

## 2021-10-13 ENCOUNTER — Encounter: Payer: Self-pay | Admitting: Cardiology

## 2021-10-13 ENCOUNTER — Other Ambulatory Visit: Payer: Self-pay

## 2021-10-13 ENCOUNTER — Ambulatory Visit (INDEPENDENT_AMBULATORY_CARE_PROVIDER_SITE_OTHER): Payer: 59 | Admitting: Cardiology

## 2021-10-13 VITALS — BP 140/90 | HR 79 | Ht 72.0 in | Wt 234.5 lb

## 2021-10-13 DIAGNOSIS — E119 Type 2 diabetes mellitus without complications: Secondary | ICD-10-CM

## 2021-10-13 DIAGNOSIS — J432 Centrilobular emphysema: Secondary | ICD-10-CM

## 2021-10-13 DIAGNOSIS — E782 Mixed hyperlipidemia: Secondary | ICD-10-CM

## 2021-10-13 DIAGNOSIS — R079 Chest pain, unspecified: Secondary | ICD-10-CM

## 2021-10-13 DIAGNOSIS — I10 Essential (primary) hypertension: Secondary | ICD-10-CM | POA: Diagnosis not present

## 2021-10-13 MED ORDER — ROSUVASTATIN CALCIUM 10 MG PO TABS: 10.0000 mg | ORAL_TABLET | Freq: Every day | ORAL | 3 refills | Status: AC

## 2021-10-13 MED ORDER — ISOSORBIDE MONONITRATE ER 30 MG PO TB24: 30.0000 mg | ORAL_TABLET | Freq: Every day | ORAL | 1 refills | Status: DC

## 2021-10-13 MED ORDER — FENOFIBRATE 160 MG PO TABS: 160.0000 mg | ORAL_TABLET | Freq: Every day | ORAL | 3 refills | Status: DC

## 2021-10-13 MED ORDER — AMLODIPINE BESYLATE 10 MG PO TABS: 10.0000 mg | ORAL_TABLET | Freq: Every day | ORAL | 3 refills | Status: AC

## 2021-10-13 MED ORDER — BENAZEPRIL HCL 40 MG PO TABS: 40.0000 mg | ORAL_TABLET | Freq: Every day | ORAL | 3 refills | Status: AC

## 2021-10-13 NOTE — Patient Instructions (Signed)
Follow-Up: Follow up with Dr. Johney Frame in 6 months  If you need a refill on your cardiac medications before your next appointment, please call your pharmacy.  Gwyndolyn Kaufman, MD [facility] [place] [address] [place], [address] [phone] (F)

## 2021-10-17 ENCOUNTER — Other Ambulatory Visit (HOSPITAL_COMMUNITY): Payer: Self-pay | Admitting: Family Medicine

## 2021-10-17 ENCOUNTER — Other Ambulatory Visit: Payer: Self-pay | Admitting: Family Medicine

## 2021-10-17 DIAGNOSIS — R319 Hematuria, unspecified: Secondary | ICD-10-CM

## 2021-10-17 DIAGNOSIS — R339 Retention of urine, unspecified: Secondary | ICD-10-CM

## 2021-10-17 DIAGNOSIS — R351 Nocturia: Secondary | ICD-10-CM

## 2021-10-21 ENCOUNTER — Other Ambulatory Visit: Payer: Self-pay

## 2021-10-21 ENCOUNTER — Ambulatory Visit (INDEPENDENT_AMBULATORY_CARE_PROVIDER_SITE_OTHER): Payer: 59 | Admitting: Urology

## 2021-10-21 ENCOUNTER — Encounter: Payer: Self-pay | Admitting: Urology

## 2021-10-21 VITALS — BP 124/80 | HR 96 | Ht 72.0 in | Wt 234.0 lb

## 2021-10-21 DIAGNOSIS — R319 Hematuria, unspecified: Secondary | ICD-10-CM

## 2021-10-21 DIAGNOSIS — N390 Urinary tract infection, site not specified: Secondary | ICD-10-CM | POA: Diagnosis not present

## 2021-10-21 DIAGNOSIS — N401 Enlarged prostate with lower urinary tract symptoms: Secondary | ICD-10-CM | POA: Diagnosis not present

## 2021-10-21 DIAGNOSIS — N138 Other obstructive and reflux uropathy: Secondary | ICD-10-CM | POA: Diagnosis not present

## 2021-10-21 DIAGNOSIS — R310 Gross hematuria: Secondary | ICD-10-CM

## 2021-10-21 LAB — URINALYSIS, ROUTINE W REFLEX MICROSCOPIC
Bilirubin, UA: NEGATIVE
Glucose, UA: NEGATIVE
Nitrite, UA: POSITIVE — AB
RBC, UA: NEGATIVE
Specific Gravity, UA: 1.02 (ref 1.005–1.030)
Urobilinogen, Ur: 2 mg/dL — ABNORMAL HIGH (ref 0.2–1.0)
pH, UA: 7.5 (ref 5.0–7.5)

## 2021-10-21 LAB — MICROSCOPIC EXAMINATION
Epithelial Cells (non renal): NONE SEEN /hpf (ref 0–10)
RBC, Urine: NONE SEEN /hpf (ref 0–2)
Renal Epithel, UA: NONE SEEN /hpf

## 2021-10-21 LAB — BLADDER SCAN AMB NON-IMAGING: Scan Result: 8

## 2021-10-21 MED ORDER — ALFUZOSIN HCL ER 10 MG PO TB24: 10.0000 mg | ORAL_TABLET | Freq: Every day | ORAL | 11 refills | Status: DC

## 2021-10-21 NOTE — Progress Notes (Signed)
Assessment: 1. Gross hematuria   2. Urinary tract infection with hematuria, site unspecified   3. BPH with obstruction/lower urinary tract symptoms     Plan: Request recent urine culture from [facility] all PSA results from [place] as prescribed Trial of alfuzosin 10 mg daily.  Rx sent. Use and side effects discussed. Today I had a discussion with the patient regarding the findings of gross hematuria including the implications and differential diagnoses associated with it.  I also discussed recommendations for further evaluation including the rationale for upper tract imaging and cystoscopy.  I discussed the nature of these procedures including potential risk and complications.  The patient expressed an understanding of these issues. We will discuss further evaluation pending results at his next visit. Return to office in 4 weeks.  Chief Complaint:  Chief Complaint  Patient presents with   Hematuria    History of Present Illness:  Travis Paul is a 56 y.o. year old male who is seen in consultation from Curlene Labrum, MD for evaluation of lower urinary tract symptoms and gross hematuria.  He reports a recent episode of inability to void for approximately 8 hours.  When he was able to void, he noted specks of blood in the urine.  Urinalysis from 10/16/2021 showed 2+ blood and positive leukocytes.  A urine culture was obtained but these results were not available.  He was initially treated with Bactrim and has since been changed to [place].  He reports a strong urine odor.  He has baseline symptoms of urinary frequency, urgency, and nocturia up to 5-6 times per night.  He reports an intermittent stream and some straining to void.  He has had UTIs at least once a year. No history of kidney stones.  He has a history of microscopic hematuria.  He has previously been evaluated with CT imaging and cystoscopy which were negative by report.   He also has a history of prostatitis.  No recent PSA results.  Past Medical History:  Past Medical History:  Diagnosis Date   Achilles tendinitis, right leg    Chronic systolic heart failure (HCC)    Diabetes mellitus without complication (HCC)    Hyperlipidemia    Hypertension    Idiopathic gout, unspecified site    Mallet finger of left hand    Old myocardial infarction     Past Surgical History:  Past Surgical History:  Procedure Laterality Date   COLONOSCOPY N/A 07/21/2019   Procedure: COLONOSCOPY;  Surgeon: Daneil Dolin, MD;  [facility];  Service: Endoscopy;  Laterality: N/A;  9:30   NO PAST SURGERIES     POLYPECTOMY  07/21/2019   Procedure: POLYPECTOMY;  Surgeon: Daneil Dolin, MD;  [facility];  Service: Endoscopy;;  colon    Allergies:  No Known Allergies  Family History:  Family History  Problem Relation Age of Onset   Liver cancer Mother    Colon polyps Paternal Uncle     Social History:  Social History   Tobacco Use   Smoking status: Former    Packs/day: 1.00    Types: Cigarettes    Quit date: 2012    Years since quitting: 11.1   Smokeless tobacco: Never  Vaping Use   Vaping Use: Never used  Substance Use Topics   Alcohol use: Yes    Comment: 1 beer on weekends   Drug use: Never    Review of symptoms:  Constitutional:  Negative  for unexplained weight loss, night sweats, fever, chills ENT:  Negative for nose bleeds, sinus pain, painful swallowing CV:  Negative for chest pain, shortness of breath, exercise intolerance, palpitations, loss of consciousness Resp:  Negative for cough, wheezing, shortness of breath GI:  Negative for nausea, vomiting, diarrhea, bloody stools GU:  Positives noted in HPI; otherwise negative for  urinary incontinence Neuro:  Negative for seizures, poor balance, limb weakness, slurred speech Psych:  Negative for lack of energy, depression, anxiety Endocrine:  Negative for polydipsia, polyuria,  symptoms of hypoglycemia (dizziness, hunger, sweating) Hematologic:  Negative for anemia, purpura, petechia, prolonged or excessive bleeding, use of anticoagulants  Allergic:  Negative for difficulty breathing or choking as a result of exposure to anything; no shellfish allergy; no allergic response (rash/itch) to materials, foods  Physical exam: BP 124/80 (BP Location: Left Arm, Patient Position: Sitting, Cuff Size: Normal)    Pulse 96    Ht 6' (1.829 m)    Wt 234 lb (106.1 kg)    BMI 31.74 kg/m  GENERAL APPEARANCE:  Well appearing, well developed, well nourished, NAD HEENT: Atraumatic, Normocephalic, oropharynx clear. NECK: Supple without lymphadenopathy or thyromegaly. LUNGS: Clear to auscultation bilaterally. HEART: Regular Rate and Rhythm without murmurs, gallops, or rubs. ABDOMEN: Soft, non-tender, No Masses. EXTREMITIES: Moves all extremities well.  Without clubbing, cyanosis, or edema. NEUROLOGIC:  Alert and oriented x 3, normal gait, CN II-XII grossly intact.  MENTAL STATUS:  Appropriate. BACK:  Non-tender to palpation.  No CVAT SKIN:  Warm, dry and intact.   GU: Penis:  uncircumcised Meatus: Normal Scrotum: normal, no masses Testis: normal without masses bilateral Epididymis: normal Prostate: 50 g, NT, no nodules Rectum: Normal tone,  no masses or tenderness   Results: U/A:  11-30 WBC, many bacteria  PVR:  8 ml

## 2021-10-21 NOTE — Progress Notes (Signed)
post void residual=8 

## 2021-10-22 ENCOUNTER — Other Ambulatory Visit (HOSPITAL_COMMUNITY): Payer: Self-pay | Admitting: Family Medicine

## 2021-10-22 DIAGNOSIS — Z87891 Personal history of nicotine dependence: Secondary | ICD-10-CM

## 2021-10-22 DIAGNOSIS — R351 Nocturia: Secondary | ICD-10-CM

## 2021-11-18 ENCOUNTER — Other Ambulatory Visit: Payer: Self-pay

## 2021-11-18 ENCOUNTER — Encounter: Payer: Self-pay | Admitting: Urology

## 2021-11-18 ENCOUNTER — Ambulatory Visit (INDEPENDENT_AMBULATORY_CARE_PROVIDER_SITE_OTHER): Payer: 59 | Admitting: Urology

## 2021-11-18 VITALS — BP 141/72 | HR 98 | Ht 72.0 in | Wt 236.0 lb

## 2021-11-18 DIAGNOSIS — N401 Enlarged prostate with lower urinary tract symptoms: Secondary | ICD-10-CM | POA: Diagnosis not present

## 2021-11-18 DIAGNOSIS — R3129 Other microscopic hematuria: Secondary | ICD-10-CM

## 2021-11-18 DIAGNOSIS — Z87898 Personal history of other specified conditions: Secondary | ICD-10-CM | POA: Diagnosis not present

## 2021-11-18 DIAGNOSIS — N390 Urinary tract infection, site not specified: Secondary | ICD-10-CM | POA: Diagnosis not present

## 2021-11-18 DIAGNOSIS — N138 Other obstructive and reflux uropathy: Secondary | ICD-10-CM | POA: Diagnosis not present

## 2021-11-18 DIAGNOSIS — R8290 Unspecified abnormal findings in urine: Secondary | ICD-10-CM

## 2021-11-18 DIAGNOSIS — R319 Hematuria, unspecified: Secondary | ICD-10-CM

## 2021-11-18 LAB — URINALYSIS, ROUTINE W REFLEX MICROSCOPIC
Bilirubin, UA: NEGATIVE
Glucose, UA: NEGATIVE
Nitrite, UA: NEGATIVE
Specific Gravity, UA: >1.03 — ABNORMAL HIGH (ref 1.005–1.030)
Urobilinogen, Ur: 1 mg/dL (ref 0.2–1.0)
pH, UA: 5.5 (ref 5.0–7.5)

## 2021-11-18 LAB — MICROSCOPIC EXAMINATION: Renal Epithel, UA: NONE SEEN /hpf

## 2021-11-18 LAB — BLADDER SCAN AMB NON-IMAGING: Scan Result: 23

## 2021-11-18 NOTE — Progress Notes (Signed)
? ?Assessment: ?1. Microscopic hematuria   ?2. History of gross hematuria   ?3. BPH with obstruction/lower urinary tract symptoms   ?4. Urinary tract infection with hematuria, site unspecified   ?5. Abnormal urine findings   ? ? ?Plan: ?Request all PSA results from [facility] ?Continue alfuzosin 10 mg daily.   ?Urine culture sent today ?Schedule for CT hematuria protocol and cystoscopy for further evaluation of hematuria and UTI's ?24 hour voiding diary x 3 days. ? ? ?Chief Complaint:  ?Chief Complaint  ?Patient presents with  ? Hematuria  ? ? ?History of Present Illness: ? ?Travis Paul is a 56 y.o. year old male who is seen for further evaluation of lower urinary tract symptoms and gross hematuria.  He reported a recent episode of inability to void for approximately 8 hours.  When he was able to void, he noted specks of blood in the urine.  Urinalysis from 10/16/2021 showed 2+ blood and positive leukocytes.  A urine culture grew E. coli.  He was initially treated with Bactrim and was changed to Macrobid.  He reported a strong urine odor.  He reported baseline symptoms of urinary frequency, urgency, and nocturia up to 5-6 times per night, intermittent stream and some straining to void.  He has had UTIs at least once per year. ?No history of kidney stones. ? ?He has a history of microscopic hematuria.  He has previously been evaluated with CT imaging and cystoscopy which were negative by report.  He also has a history of prostatitis. ? ?No recent PSA results. ? ?He was started on alfuzosin at his visit in 2/23. ?PVR = 8 mL. ? ?He returns today for follow-up.  He continues on alfuzosin.  He has noted some improvement in his lower urinary tract symptoms.  He continues to have nocturia x6 at times.  He has occasional dysuria.  No further gross hematuria. ?IPSS = 14 today. ? ?Portions of the above documentation were copied from a prior visit for review purposes only. ?  ? ?Past Medical History:  ?Past Medical  History:  ?Diagnosis Date  ? Achilles tendinitis, right leg   ? Chronic systolic heart failure (HCC)   ? Diabetes mellitus without complication (HCC)   ? Hyperlipidemia   ? Hypertension   ? Idiopathic gout, unspecified site   ? Mallet finger of left hand   ? Old myocardial infarction   ? ? ?Past Surgical History:  ?Past Surgical History:  ?Procedure Laterality Date  ? COLONOSCOPY N/A 07/21/2019  ? Procedure: COLONOSCOPY;  Surgeon: Corbin Ade, MD;  [facility];  Service: Endoscopy;  Laterality: N/A;  9:30  ? NO PAST SURGERIES    ? POLYPECTOMY  07/21/2019  ? Procedure: POLYPECTOMY;  Surgeon: Corbin Ade, MD;  [facility];  Service: Endoscopy;;  colon  ? ? ?Allergies:  ?No Known Allergies ? ?Family History:  ?Family History  ?Problem Relation Age of Onset  ? Liver cancer Mother   ? Colon polyps Paternal Uncle   ? ? ?Social History:  ?Social History  ? ?Tobacco Use  ? Smoking status: Former  ?  Packs/day: 1.00  ?  Types: Cigarettes  ?  Quit date: 2012  ?  Years since quitting: 11.1  ? Smokeless tobacco: Never  ?Vaping Use  ? Vaping Use: Never used  ?Substance Use Topics  ? Alcohol use: Yes  ?  Comment: 1 beer on weekends  ? Drug use: Never  ? ? ?ROS: ?Constitutional:  Negative  for fever, chills, weight loss ?CV: Negative for chest pain, previous MI, hypertension ?Respiratory:  Negative for shortness of breath, wheezing, sleep apnea, frequent cough ?GI:  Negative for nausea, vomiting, bloody stool, GERD ? ?Physical exam: ?BP (!) 141/72   Pulse 98   Ht 6' (1.829 m)   Wt 236 lb (107 kg)   BMI 32.01 kg/m?  ?GENERAL APPEARANCE:  Well appearing, well developed, well nourished, NAD ?HEENT:  Atraumatic, normocephalic, oropharynx clear ?NECK:  Supple without lymphadenopathy or thyromegaly ?ABDOMEN:  Soft, non-tender, no masses ?EXTREMITIES:  Moves all extremities well, without clubbing, cyanosis, or edema ?NEUROLOGIC:  Alert and oriented x 3, normal gait, CN II-XII grossly intact ?MENTAL  STATUS:  appropriate ?BACK:  Non-tender to palpation, No CVAT ?SKIN:  Warm, dry, and intact ? ?Results: ?U/A: 6-10 WBCs, 3-10 RBCs, few bacteria ? ?PVR = 23 mL ?

## 2021-11-20 LAB — URINE CULTURE: Organism ID, Bacteria: NO GROWTH

## 2021-11-21 ENCOUNTER — Telehealth: Payer: Self-pay

## 2021-11-21 NOTE — Telephone Encounter (Signed)
Patient called with no answer.  ?Message left to return call to office on Monday. ?

## 2021-11-24 NOTE — Telephone Encounter (Signed)
Per Dr. Pete Glatter:  Please notify the patient that his urine culture did not show evidence of UTI.  Recommend proceeding with evaluation with CT and cystoscopy as scheduled. ? ?Patient notified. He is not able to get CT until 4/21 so cysto appt will be rescheduled until after CT.  ?

## 2021-12-10 ENCOUNTER — Other Ambulatory Visit: Payer: 59 | Admitting: Urology

## 2022-01-02 ENCOUNTER — Ambulatory Visit (HOSPITAL_COMMUNITY)
Admission: RE | Admit: 2022-01-02 | Discharge: 2022-01-02 | Disposition: A | Discharge status: Home / Self Care | Payer: 59 | Source: Ambulatory Visit | Attending: Urology | Admitting: Urology

## 2022-01-02 ENCOUNTER — Ambulatory Visit (HOSPITAL_COMMUNITY)
Admission: RE | Admit: 2022-01-02 | Discharge: 2022-01-02 | Disposition: A | Discharge status: Home / Self Care | Payer: 59 | Source: Ambulatory Visit | Attending: Family Medicine | Admitting: Family Medicine

## 2022-01-02 ENCOUNTER — Encounter (HOSPITAL_COMMUNITY): Payer: Self-pay

## 2022-01-02 DIAGNOSIS — R351 Nocturia: Secondary | ICD-10-CM | POA: Insufficient documentation

## 2022-01-02 DIAGNOSIS — Z87891 Personal history of nicotine dependence: Secondary | ICD-10-CM | POA: Insufficient documentation

## 2022-01-02 DIAGNOSIS — R319 Hematuria, unspecified: Secondary | ICD-10-CM | POA: Insufficient documentation

## 2022-01-02 DIAGNOSIS — R3129 Other microscopic hematuria: Secondary | ICD-10-CM | POA: Diagnosis present

## 2022-01-02 DIAGNOSIS — R339 Retention of urine, unspecified: Secondary | ICD-10-CM | POA: Diagnosis present

## 2022-01-02 LAB — POCT I-STAT CREATININE: Creatinine, Ser: 1.4 mg/dL — ABNORMAL HIGH (ref 0.61–1.24)

## 2022-01-02 MED ORDER — IOHEXOL 300 MG/ML  SOLN
100.0000 mL | Freq: Once | INTRAMUSCULAR | Status: AC | PRN
  Administered 2022-01-02: 100 mL via INTRAVENOUS

## 2022-01-06 ENCOUNTER — Encounter: Payer: Self-pay | Admitting: Urology

## 2022-01-06 ENCOUNTER — Ambulatory Visit (INDEPENDENT_AMBULATORY_CARE_PROVIDER_SITE_OTHER): Payer: 59 | Admitting: Urology

## 2022-01-06 VITALS — BP 152/95 | HR 93

## 2022-01-06 DIAGNOSIS — N201 Calculus of ureter: Secondary | ICD-10-CM

## 2022-01-06 DIAGNOSIS — R319 Hematuria, unspecified: Secondary | ICD-10-CM

## 2022-01-06 DIAGNOSIS — Z87898 Personal history of other specified conditions: Secondary | ICD-10-CM

## 2022-01-06 DIAGNOSIS — N401 Enlarged prostate with lower urinary tract symptoms: Secondary | ICD-10-CM | POA: Diagnosis not present

## 2022-01-06 DIAGNOSIS — N390 Urinary tract infection, site not specified: Secondary | ICD-10-CM

## 2022-01-06 DIAGNOSIS — R8290 Unspecified abnormal findings in urine: Secondary | ICD-10-CM

## 2022-01-06 DIAGNOSIS — N138 Other obstructive and reflux uropathy: Secondary | ICD-10-CM

## 2022-01-06 MED ORDER — NITROFURANTOIN MONOHYD MACRO 100 MG PO CAPS: 100.0000 mg | ORAL_CAPSULE | Freq: Two times a day (BID) | ORAL | 0 refills | Status: AC

## 2022-01-06 MED ORDER — CIPROFLOXACIN HCL 500 MG PO TABS: 500.0000 mg | ORAL_TABLET | Freq: Once | ORAL | Status: DC

## 2022-01-06 NOTE — Progress Notes (Addendum)
? ?Assessment: ?1. History of gross hematuria   ?2. BPH with obstruction/lower urinary tract symptoms   ?3. Urinary tract infection with hematuria, site unspecified   ?4. Ureteral calculus, right   ?5. Abnormal urine findings   ? ? ?Plan:  ?I personally reviewed the CT study from 01/03/2022 with results as noted below.  I discussed these findings with the patient in detail today. ?Continue alfuzosin 10 mg daily.   ?Urine culture sent today ?Begin Macrobid twice daily x10 days. ?Consider daily antibiotic for UTI prevention once culture results available. ?Request any PSA results from [facility] -second request ?Strain urine. ?Review 24-hour voiding diary. ?Return to office in 3 weeks for cystoscopy. ? ?Addendum: ?Review of the patient's 24-hour voiding diary: ?  Night time volume Total volume % nighttime output  ?Day 1 1295 ml 1895 ml 68%  ?Day 2 2300 ml 2600 ml 88%  ?Day 3 1550 ml 2070 ml 74%  ? ?Results suggests nocturnal polyuria. ?Will discuss options for management at next visit. ? ?Chief Complaint:  ?Chief Complaint  ?Patient presents with  ? Hematuria  ? ? ?History of Present Illness: ? ?Travis Paul is a 56 y.o. year old male who is seen for further evaluation of lower urinary tract symptoms and gross hematuria.  At his initial visit in February 2023, he reported a recent episode of inability to void for approximately 8 hours.  When he was able to void, he noted specks of blood in the urine.  Urinalysis from 10/16/2021 showed 2+ blood and positive leukocytes.  A urine culture grew E. coli.  He was initially treated with Bactrim and was changed to Macrobid.  He reported a strong urine odor.  He reported baseline symptoms of urinary frequency, urgency, and nocturia up to 5-6 times per night, intermittent stream and some straining to void.  He has had UTIs at least once per year. ?No history of kidney stones. ? ?He has a history of microscopic hematuria.  He has previously been evaluated with CT imaging and  cystoscopy which were negative by report.  He also has a history of prostatitis. ? ?No recent PSA results. ? ?He was started on alfuzosin at his visit in 2/23. ?PVR = 8 mL. ?He continued on alfuzosin with some improvement in his lower urinary tract symptoms.  He continued to have nocturia x 6 at times and occasional dysuria.  No further gross hematuria. ?IPSS = 14. ?Urine culture from 3/23 showed no growth. ? ?CT hematuria protocol from 01/03/2022 showed a 4 mm calculus in the lower pole of the left kidney and mild right ureterectasis with a 4 mm calculus in the distal right ureter. ? ?He presents today for further evaluation of his gross hematuria with cystoscopy. ?He is not having any dysuria or gross hematuria.  No flank pain.  No fevers or chills.  He continues to have frequency, urgency, and nocturia. ? ?Portions of the above documentation were copied from a prior visit for review purposes only. ?  ? ?Past Medical History:  ?Past Medical History:  ?Diagnosis Date  ? Achilles tendinitis, right leg   ? Chronic systolic heart failure (HCC)   ? Diabetes mellitus without complication (HCC)   ? Hyperlipidemia   ? Hypertension   ? Idiopathic gout, unspecified site   ? Mallet finger of left hand   ? Old myocardial infarction   ? ? ?Past Surgical History:  ?Past Surgical History:  ?Procedure Laterality Date  ? COLONOSCOPY N/A 07/21/2019  ? Procedure:  COLONOSCOPY;  Surgeon: Corbin Ade, MD;  [facility];  Service: Endoscopy;  Laterality: N/A;  9:30  ? NO PAST SURGERIES    ? POLYPECTOMY  07/21/2019  ? Procedure: POLYPECTOMY;  Surgeon: Corbin Ade, MD;  [facility];  Service: Endoscopy;;  colon  ? ? ?Allergies:  ?No Known Allergies ? ?Family History:  ?Family History  ?Problem Relation Age of Onset  ? Liver cancer Mother   ? Colon polyps Paternal Uncle   ? ? ?Social History:  ?Social History  ? ?Tobacco Use  ? Smoking status: Former  ?  Packs/day: 1.00  ?  Types: Cigarettes  ?  Quit date: 2012   ?  Years since quitting: 11.3  ? Smokeless tobacco: Never  ?Vaping Use  ? Vaping Use: Never used  ?Substance Use Topics  ? Alcohol use: Yes  ?  Comment: 1 beer on weekends  ? Drug use: Never  ? ? ?ROS: ?Constitutional:  Negative for fever, chills, weight loss ?CV: Negative for chest pain, previous MI, hypertension ?Respiratory:  Negative for shortness of breath, wheezing, sleep apnea, frequent cough ?GI:  Negative for nausea, vomiting, bloody stool, GERD ? ?Physical exam: ?BP (!) 152/95   Pulse 93  ?GENERAL APPEARANCE:  Well appearing, well developed, well nourished, NAD ?HEENT:  Atraumatic, normocephalic, oropharynx clear ?NECK:  Supple without lymphadenopathy or thyromegaly ?ABDOMEN:  Soft, non-tender, no masses ?EXTREMITIES:  Moves all extremities well, without clubbing, cyanosis, or edema ?NEUROLOGIC:  Alert and oriented x 3, normal gait, CN II-XII grossly intact ?MENTAL STATUS:  appropriate ?BACK:  Non-tender to palpation, No CVAT ?SKIN:  Warm, dry, and intact ? ?Results: ?U/A: 6-10 WBCs, 3-10 RBCs, many bacteria, nitrite positive ? ?

## 2022-01-07 LAB — URINALYSIS, ROUTINE W REFLEX MICROSCOPIC
Bilirubin, UA: NEGATIVE
Glucose, UA: NEGATIVE
Nitrite, UA: POSITIVE — AB
Specific Gravity, UA: >1.03 — ABNORMAL HIGH (ref 1.005–1.030)
Urobilinogen, Ur: 1 mg/dL (ref 0.2–1.0)
pH, UA: 6 (ref 5.0–7.5)

## 2022-01-07 LAB — MICROSCOPIC EXAMINATION: Renal Epithel, UA: NONE SEEN /hpf

## 2022-01-09 ENCOUNTER — Telehealth: Payer: Self-pay

## 2022-01-09 LAB — URINE CULTURE

## 2022-01-09 MED ORDER — NITROFURANTOIN MONOHYD MACRO 100 MG PO CAPS: 100.0000 mg | ORAL_CAPSULE | Freq: Every day | ORAL | 2 refills | Status: DC

## 2022-01-09 NOTE — Addendum Note (Signed)
Addended by: Milderd Meager on: 01/09/2022 08:21 AM ? ? Modules accepted: Orders ? ?

## 2022-01-09 NOTE — Telephone Encounter (Signed)
Pharmacy called to clarify rx sent in.  Spoke to [employer] to verify sig on both rx, she voiced understanding. ?

## 2022-01-12 ENCOUNTER — Other Ambulatory Visit: Payer: Self-pay

## 2022-01-29 ENCOUNTER — Ambulatory Visit (INDEPENDENT_AMBULATORY_CARE_PROVIDER_SITE_OTHER): Payer: 59 | Admitting: Urology

## 2022-01-29 ENCOUNTER — Encounter: Payer: Self-pay | Admitting: Urology

## 2022-01-29 VITALS — BP 131/82 | HR 76

## 2022-01-29 DIAGNOSIS — R8290 Unspecified abnormal findings in urine: Secondary | ICD-10-CM

## 2022-01-29 DIAGNOSIS — R3581 Nocturnal polyuria: Secondary | ICD-10-CM

## 2022-01-29 DIAGNOSIS — Z87898 Personal history of other specified conditions: Secondary | ICD-10-CM

## 2022-01-29 DIAGNOSIS — R310 Gross hematuria: Secondary | ICD-10-CM

## 2022-01-29 DIAGNOSIS — N138 Other obstructive and reflux uropathy: Secondary | ICD-10-CM

## 2022-01-29 DIAGNOSIS — Z87440 Personal history of urinary (tract) infections: Secondary | ICD-10-CM

## 2022-01-29 DIAGNOSIS — N401 Enlarged prostate with lower urinary tract symptoms: Secondary | ICD-10-CM | POA: Diagnosis not present

## 2022-01-29 DIAGNOSIS — N201 Calculus of ureter: Secondary | ICD-10-CM | POA: Diagnosis not present

## 2022-01-29 LAB — URINALYSIS, ROUTINE W REFLEX MICROSCOPIC
Bilirubin, UA: NEGATIVE
Glucose, UA: NEGATIVE
Ketones, UA: NEGATIVE
Nitrite, UA: POSITIVE — AB
Specific Gravity, UA: >1.03 — ABNORMAL HIGH (ref 1.005–1.030)
Urobilinogen, Ur: 0.2 mg/dL (ref 0.2–1.0)
pH, UA: 5.5 (ref 5.0–7.5)

## 2022-01-29 LAB — MICROSCOPIC EXAMINATION
Renal Epithel, UA: NONE SEEN /hpf
WBC, UA: >30 /hpf — AB (ref 0–5)

## 2022-01-29 LAB — BLADDER SCAN AMB NON-IMAGING: Scan Result: 40

## 2022-01-29 MED ORDER — CIPROFLOXACIN HCL 500 MG PO TABS: 500.0000 mg | ORAL_TABLET | Freq: Once | ORAL | Status: DC

## 2022-01-29 MED ORDER — LEVOFLOXACIN 500 MG PO TABS
500.0000 mg | ORAL_TABLET | Freq: Every day | ORAL | 0 refills | Status: AC
Start: 2022-01-29 — End: 2022-02-05

## 2022-01-29 NOTE — Progress Notes (Signed)
post void residual=40mL  

## 2022-01-29 NOTE — Progress Notes (Signed)
Assessment: 1. History of gross hematuria   2. BPH with obstruction/lower urinary tract symptoms   3. Ureteral calculus, right   4. History of UTI   5. Abnormal urine findings   6. Nocturnal polyuria     Plan:  We will cancel cystoscopy for today due to possible UTI. Urine culture sent. Begin Levaquin 500 mg daily x 7 days Continue alfuzosin 10 mg daily.   Strain urine. KUB at [facility] for evaluation of right ureteral calculus I discussed the results on the voiding diary suggesting nocturnal polyuria.  Options for management discussed.  He does not wish to pursue any treatment at this time. Return to office in 2 weeks for cystoscopy.   Chief Complaint:  Chief Complaint  Patient presents with   Hematuria    History of Present Illness:  Travis Paul is a 56 y.o. year old male who is seen for further evaluation of lower urinary tract symptoms and gross hematuria.  At his initial visit in February 2023, he reported a recent episode of inability to void for approximately 8 hours.  When he was able to void, he noted specks of blood in the urine.  Urinalysis from 10/16/2021 showed 2+ blood and positive leukocytes.  A urine culture grew E. coli.  He was initially treated with Bactrim and was changed to [place].  He reported a strong urine odor.  He reported baseline symptoms of urinary frequency, urgency, and nocturia up to 5-6 times per night, intermittent stream and some straining to void.  He has had UTIs at least once per year. No history of kidney stones.  He has a history of microscopic hematuria.  He has previously been evaluated with CT imaging and cystoscopy which were negative by report.  He also has a history of prostatitis.  No recent PSA results.  He was started on alfuzosin at his visit in 2/23. PVR = 8 mL. He continued on alfuzosin with some improvement in his lower urinary tract symptoms.  He continued to have nocturia x 6 at times and occasional dysuria.  No  further gross hematuria. IPSS = 14. Urine culture from 3/23 showed no growth.  CT hematuria protocol from 01/03/2022 showed a 4 mm calculus in the lower pole of the left kidney and mild right ureterectasis with a 4 mm calculus in the distal right ureter.  At his visit on 01/06/2022, he was not having any dysuria or gross hematuria.  No flank pain.  No fevers or chills.  He continued to have frequency, urgency, and nocturia. Urine culture grew >100 K E. coli.  He was treated with Macrobid x10 days.  He was also started on daily Macrobid for UTI prevention.  He was continued on alfuzosin. 24-hour voiding diary indicated significant nocturnal polyuria with nighttime outputs of 68%, 88%, and 74% of total volume.  He presents today for further evaluation with cystoscopy. He continues on alfuzosin and daily Macrobid.  He continues to report frequency, nocturia, and incomplete emptying.  No gross hematuria.  He reports occasional dysuria.  No flank pain.  He is not aware of passing a stone. IPSS = 11 today.  Portions of the above documentation were copied from a prior visit for review purposes only.    Past Medical History:  Past Medical History:  Diagnosis Date   Achilles tendinitis, right leg    Chronic systolic heart failure ([place])    Diabetes mellitus without complication (HCC)    Hyperlipidemia    Hypertension  Idiopathic gout, unspecified site    Mallet finger of left hand    Old myocardial infarction     Past Surgical History:  Past Surgical History:  Procedure Laterality Date   COLONOSCOPY N/A 07/21/2019   Procedure: COLONOSCOPY;  Surgeon: Daneil Dolin, MD;  [facility];  Service: Endoscopy;  Laterality: N/A;  9:30   NO PAST SURGERIES     POLYPECTOMY  07/21/2019   Procedure: POLYPECTOMY;  Surgeon: Daneil Dolin, MD;  [facility];  Service: Endoscopy;;  colon    Allergies:  No Known Allergies  Family History:  Family History  Problem Relation Age  of Onset   Liver cancer Mother    Colon polyps Paternal Uncle     Social History:  Social History   Tobacco Use   Smoking status: Former    Packs/day: 1.00    Types: Cigarettes    Quit date: 2012    Years since quitting: 11.3   Smokeless tobacco: Never  Vaping Use   Vaping Use: Never used  Substance Use Topics   Alcohol use: Yes    Comment: 1 beer on weekends   Drug use: Never    ROS: Constitutional:  Negative for fever, chills, weight loss CV: Negative for chest pain, previous MI, hypertension Respiratory:  Negative for shortness of breath, wheezing, sleep apnea, frequent cough GI:  Negative for nausea, vomiting, bloody stool, GERD  Physical exam: BP 131/82   Pulse 76  GENERAL APPEARANCE:  Well appearing, well developed, well nourished, NAD HEENT:  Atraumatic, normocephalic, oropharynx clear NECK:  Supple without lymphadenopathy or thyromegaly ABDOMEN:  Soft, non-tender, no masses EXTREMITIES:  Moves all extremities well, without clubbing, cyanosis, or edema NEUROLOGIC:  Alert and oriented x 3, normal gait, CN II-XII grossly intact MENTAL STATUS:  appropriate BACK:  Non-tender to palpation, No CVAT SKIN:  Warm, dry, and intact  Results: U/A: >30 WBCs, 0-2 RBCs, moderate bacteria, nitrite positive  PVR: 40 ml

## 2022-01-31 LAB — URINE CULTURE

## 2022-02-03 ENCOUNTER — Telehealth: Payer: Self-pay

## 2022-02-03 NOTE — Telephone Encounter (Signed)
-----   Message from Primus Bravo, MD sent at 02/02/2022  8:58 AM EDT ----- Please notify the patient that his urine culture did not show an obvious UTI.  I would recommend that he complete the Levaquin as prescribed and keep his appointment for 02/11/2022 for cystoscopy.

## 2022-02-03 NOTE — Telephone Encounter (Signed)
FYI informed pt of his results, he wanted to let you know that he may have to r/s that apt.  He will be out of town and is not sure that he will be back by then.  He will reach out on 3/30 to confirm or r/s apt.

## 2022-02-11 ENCOUNTER — Ambulatory Visit: Payer: 59 | Admitting: Urology

## 2022-02-12 ENCOUNTER — Ambulatory Visit (INDEPENDENT_AMBULATORY_CARE_PROVIDER_SITE_OTHER): Payer: 59 | Admitting: Urology

## 2022-02-12 ENCOUNTER — Encounter: Payer: Self-pay | Admitting: Urology

## 2022-02-12 ENCOUNTER — Ambulatory Visit (HOSPITAL_COMMUNITY)
Admission: RE | Admit: 2022-02-12 | Discharge: 2022-02-12 | Disposition: A | Discharge status: Home / Self Care | Payer: 59 | Source: Ambulatory Visit | Attending: Urology | Admitting: Urology

## 2022-02-12 VITALS — BP 149/95 | HR 94

## 2022-02-12 DIAGNOSIS — R310 Gross hematuria: Secondary | ICD-10-CM | POA: Diagnosis not present

## 2022-02-12 DIAGNOSIS — N201 Calculus of ureter: Secondary | ICD-10-CM

## 2022-02-12 DIAGNOSIS — R3581 Nocturnal polyuria: Secondary | ICD-10-CM

## 2022-02-12 DIAGNOSIS — N401 Enlarged prostate with lower urinary tract symptoms: Secondary | ICD-10-CM

## 2022-02-12 DIAGNOSIS — Z87898 Personal history of other specified conditions: Secondary | ICD-10-CM

## 2022-02-12 DIAGNOSIS — Z87440 Personal history of urinary (tract) infections: Secondary | ICD-10-CM

## 2022-02-12 DIAGNOSIS — N138 Other obstructive and reflux uropathy: Secondary | ICD-10-CM | POA: Diagnosis not present

## 2022-02-12 DIAGNOSIS — R339 Retention of urine, unspecified: Secondary | ICD-10-CM

## 2022-02-12 LAB — URINALYSIS, ROUTINE W REFLEX MICROSCOPIC
Bilirubin, UA: NEGATIVE
Glucose, UA: NEGATIVE
Ketones, UA: NEGATIVE
Leukocytes,UA: NEGATIVE
Nitrite, UA: NEGATIVE
RBC, UA: NEGATIVE
Specific Gravity, UA: 1.02 (ref 1.005–1.030)
Urobilinogen, Ur: 4 mg/dL — ABNORMAL HIGH (ref 0.2–1.0)
pH, UA: 7 (ref 5.0–7.5)

## 2022-02-12 LAB — MICROSCOPIC EXAMINATION
Bacteria, UA: NONE SEEN
Epithelial Cells (non renal): NONE SEEN /hpf (ref 0–10)
RBC, Urine: NONE SEEN /hpf (ref 0–2)
Renal Epithel, UA: NONE SEEN /hpf
WBC, UA: NONE SEEN /hpf (ref 0–5)

## 2022-02-12 MED ORDER — CIPROFLOXACIN HCL 500 MG PO TABS
500.0000 mg | ORAL_TABLET | Freq: Once | ORAL | Status: AC
  Administered 2022-02-12: 500 mg via ORAL

## 2022-02-12 NOTE — Progress Notes (Signed)
Assessment: 1. BPH with obstruction/lower urinary tract symptoms   2. History of gross hematuria   3. Ureteral calculus, right   4. History of UTI   5. Nocturnal polyuria      Plan:  Continue alfuzosin 10 mg daily.   Strain urine. KUB at [facility] for evaluation of right ureteral calculus Return to office in 6 weeks Needs PSA if not further UTIs   Chief Complaint:  Chief Complaint  Patient presents with   Hematuria    History of Present Illness:  Travis Paul is a 56 y.o. year old male who is seen for further evaluation of lower urinary tract symptoms and gross hematuria.  At his initial visit in February 2023, he reported a recent episode of inability to void for approximately 8 hours.  When he was able to void, he noted specks of blood in the urine.  Urinalysis from 10/16/2021 showed 2+ blood and positive leukocytes.  A urine culture grew E. coli.  He was initially treated with Bactrim and was changed to Macrobid.  He reported a strong urine odor.  He reported baseline symptoms of urinary frequency, urgency, and nocturia up to 5-6 times per night, intermittent stream and some straining to void.  He has had UTIs at least once per year. No history of kidney stones.  He has a history of microscopic hematuria.  He has previously been evaluated with CT imaging and cystoscopy which were negative by report.  He also has a history of prostatitis.  No recent PSA results.  He was started on alfuzosin at his visit in 2/23. PVR = 8 mL. He continued on alfuzosin with some improvement in his lower urinary tract symptoms.  He continued to have nocturia x 6 at times and occasional dysuria.  No further gross hematuria. IPSS = 14. Urine culture from 3/23 showed no growth.  CT hematuria protocol from 01/03/2022 showed a 4 mm calculus in the lower pole of the left kidney and mild right ureterectasis with a 4 mm calculus in the distal right ureter.  At his visit on 01/06/2022, he was  not having any dysuria or gross hematuria.  No flank pain.  No fevers or chills.  He continued to have frequency, urgency, and nocturia. Urine culture grew >100 K E. coli.  He was treated with Macrobid x10 days.  He was also started on daily Macrobid for UTI prevention.  He was continued on alfuzosin. 24-hour voiding diary indicated significant nocturnal polyuria with nighttime outputs of 68%, 88%, and 74% of total volume.  At his visit on 01/29/22, he continued on alfuzosin and daily Macrobid.  He continued to report frequency, nocturia, and incomplete emptying.  No gross hematuria.  He also reported occasional dysuria.  No flank pain.  He was not aware of passing a stone. IPSS = 11.  PVR = 40 ml. Cystoscopy was postponed due to an abnormal urinalysis and possible UTI.  He was treated with Levaquin x7 days.  Urine culture grew <10K colonies.  He presents today for follow-up of ureteral calculus and evaluation of gross hematuria and LUTS with cystoscopy.  He has completed the antibiotics.  He continues on alfuzosin.  He continues with sensation of incomplete emptying, frequency, intermittent stream, and nocturia x2.  He feels like his LUTS are slightly improved.  He is not aware of passing a stone.  He has not had the KUB done yet. IPSS = 18 today.   Portions of the above documentation were copied  from a prior visit for review purposes only.    Past Medical History:  Past Medical History:  Diagnosis Date   Achilles tendinitis, right leg    Chronic systolic heart failure (HCC)    Diabetes mellitus without complication (HCC)    Hyperlipidemia    Hypertension    Idiopathic gout, unspecified site    Mallet finger of left hand    Old myocardial infarction     Past Surgical History:  Past Surgical History:  Procedure Laterality Date   COLONOSCOPY N/A 07/21/2019   Procedure: COLONOSCOPY;  Surgeon: Corbin Ade, MD;  Location: [facility];  Service: Endoscopy;  Laterality: N/A;  9:30   NO  PAST SURGERIES     POLYPECTOMY  07/21/2019   Procedure: POLYPECTOMY;  Surgeon: Corbin Ade, MD;  Location: [facility];  Service: Endoscopy;;  colon    Allergies:  No Known Allergies  Family History:  Family History  Problem Relation Age of Onset   Liver cancer Mother    Colon polyps Paternal Uncle     Social History:  Social History   Tobacco Use   Smoking status: Former    Packs/day: 1.00    Types: Cigarettes    Quit date: 2012    Years since quitting: 11.4   Smokeless tobacco: Never  Vaping Use   Vaping Use: Never used  Substance Use Topics   Alcohol use: Yes    Comment: 1 beer on weekends   Drug use: Never    ROS: Constitutional:  Negative for fever, chills, weight loss CV: Negative for chest pain, previous MI, hypertension Respiratory:  Negative for shortness of breath, wheezing, sleep apnea, frequent cough GI:  Negative for nausea, vomiting, bloody stool, GERD  Physical exam: BP (!) 149/95   Pulse 94  GENERAL APPEARANCE:  Well appearing, well developed, well nourished, NAD HEENT:  Atraumatic, normocephalic, oropharynx clear NECK:  Supple without lymphadenopathy or thyromegaly ABDOMEN:  Soft, non-tender, no masses EXTREMITIES:  Moves all extremities well, without clubbing, cyanosis, or edema NEUROLOGIC:  Alert and oriented x 3, normal gait, CN II-XII grossly intact MENTAL STATUS:  appropriate BACK:  Non-tender to palpation, No CVAT SKIN:  Warm, dry, and intact  Results: U/A: 2+ protein  Procedure:  Flexible Cystourethroscopy  Pre-operative Diagnosis: Lower urinary tract symptoms; gross hematuria  Post-operative Diagnosis: Lower urinary tract symptoms; gross hematuria  Anesthesia:  local with lidocaine jelly  Surgical Narrative:  After appropriate informed consent was obtained, the patient was prepped and draped in the usual sterile fashion in the supine position.  The patient was correctly identified and the proper procedure delineated prior  to proceeding.  Sterile lidocaine gel was instilled in the urethra. The flexible cystoscope was introduced without difficulty.  Findings:  Anterior urethra: Normal  Posterior urethra: Lateral lobe hypertrophy; no significant median lobe  Bladder: Normal  Ureteral orifices: normal  Additional findings: none  Saline bladder wash for cytology was performed.    The cystoscope was then removed.  The patient tolerated the procedure well.

## 2022-02-12 NOTE — Progress Notes (Signed)
Urine cytology tracking number [id number]

## 2022-02-16 ENCOUNTER — Telehealth: Payer: Self-pay

## 2022-02-16 NOTE — Telephone Encounter (Signed)
-----   Message from Milderd Meager, MD sent at 02/13/2022  8:53 AM EDT ----- Please let the patient know that the right ureteral stone appears to be visible on the plain x-ray and in a similar location.  With these findings, his options would be to continue to try to pass the stone or to consider treatment.  If he is interested in treatment, please let me know and I will be glad to discuss with him on the phone.

## 2022-02-16 NOTE — Telephone Encounter (Signed)
Left message to return call to office.

## 2022-02-19 ENCOUNTER — Other Ambulatory Visit: Payer: Self-pay | Admitting: Urology

## 2022-02-19 NOTE — Telephone Encounter (Signed)
Called patient no answer.

## 2022-02-19 NOTE — Telephone Encounter (Signed)
Patient returned call to office notified to keep scheduled f/u. Voiced understanding.

## 2022-02-20 ENCOUNTER — Telehealth: Payer: Self-pay

## 2022-02-20 NOTE — Telephone Encounter (Signed)
-----   Message from Milderd Meager, MD sent at 02/19/2022  5:41 PM EDT ----- Please notify Travis Paul that his urine cytology did not show any malignant or suspicious cells.

## 2022-02-20 NOTE — Telephone Encounter (Signed)
Letter sent.

## 2022-02-27 ENCOUNTER — Other Ambulatory Visit: Payer: Self-pay | Admitting: Student

## 2022-03-19 ENCOUNTER — Other Ambulatory Visit (HOSPITAL_COMMUNITY): Payer: Self-pay | Admitting: Family Medicine

## 2022-03-19 ENCOUNTER — Other Ambulatory Visit: Payer: Self-pay | Admitting: Family Medicine

## 2022-03-19 DIAGNOSIS — R911 Solitary pulmonary nodule: Secondary | ICD-10-CM

## 2022-03-25 ENCOUNTER — Ambulatory Visit (INDEPENDENT_AMBULATORY_CARE_PROVIDER_SITE_OTHER): Payer: 59 | Admitting: Urology

## 2022-03-25 ENCOUNTER — Encounter: Payer: Self-pay | Admitting: Urology

## 2022-03-25 ENCOUNTER — Ambulatory Visit (HOSPITAL_COMMUNITY)
Admission: RE | Admit: 2022-03-25 | Discharge: 2022-03-25 | Disposition: A | Discharge status: Home / Self Care | Payer: 59 | Source: Ambulatory Visit | Attending: Urology | Admitting: Urology

## 2022-03-25 VITALS — BP 155/93 | HR 64

## 2022-03-25 DIAGNOSIS — N401 Enlarged prostate with lower urinary tract symptoms: Secondary | ICD-10-CM

## 2022-03-25 DIAGNOSIS — R310 Gross hematuria: Secondary | ICD-10-CM | POA: Diagnosis not present

## 2022-03-25 DIAGNOSIS — N201 Calculus of ureter: Secondary | ICD-10-CM | POA: Insufficient documentation

## 2022-03-25 DIAGNOSIS — Z87440 Personal history of urinary (tract) infections: Secondary | ICD-10-CM | POA: Diagnosis not present

## 2022-03-25 DIAGNOSIS — Z87898 Personal history of other specified conditions: Secondary | ICD-10-CM

## 2022-03-25 DIAGNOSIS — N138 Other obstructive and reflux uropathy: Secondary | ICD-10-CM

## 2022-03-25 LAB — URINALYSIS, ROUTINE W REFLEX MICROSCOPIC
Bilirubin, UA: NEGATIVE
Glucose, UA: NEGATIVE
Ketones, UA: NEGATIVE
Leukocytes,UA: NEGATIVE
Nitrite, UA: NEGATIVE
RBC, UA: NEGATIVE
Specific Gravity, UA: 1.02 (ref 1.005–1.030)
Urobilinogen, Ur: 0.2 mg/dL (ref 0.2–1.0)
pH, UA: 7 (ref 5.0–7.5)

## 2022-03-25 LAB — MICROSCOPIC EXAMINATION
Bacteria, UA: NONE SEEN
Epithelial Cells (non renal): NONE SEEN /hpf (ref 0–10)
RBC, Urine: NONE SEEN /hpf (ref 0–2)
Renal Epithel, UA: NONE SEEN /hpf
WBC, UA: NONE SEEN /hpf (ref 0–5)

## 2022-03-25 NOTE — Progress Notes (Addendum)
Assessment: 1. Ureteral calculus, right   2. BPH with obstruction/lower urinary tract symptoms   3. History of gross hematuria   4. History of UTI      Plan:  Continue alfuzosin 10 mg daily.   Strain urine. KUB today at [facility] for evaluation of right ureteral calculus PSA, BMP today He has not passed the right ureteral calculus.  Options for management of the right ureteral calculus discussed including shockwave lithotripsy and ureteroscopic stone manipulation.  Each procedure including potential risk reviewed.  He would like to proceed with shockwave lithotripsy pending results of KUB today.  Addendum: I reviewed the KUB study from today.  The 4 x 2 mm calcification in the right pelvis remains and is suspicious for a persistent distal right ureteral calculus. I discussed these findings with the patient.  He would like to proceed with shockwave lithotripsy.  The patient will be scheduled for Right ESL at [facility].  Surgical request is placed with the surgery schedulers and will be scheduled at the patient's/family request. Informed consent is given as documented below. Anesthesia:  local with IV sedation  The patient does not have sleep apnea, history of MRSA, history of VRE, history of cardiac device requiring special anesthetic needs. Patient is stable and considered clear for surgical in an outpatient ambulatory surgery setting as well as patient hospital setting.  Consent for Operation or Procedure: Provider Certification I hereby certify that the nature, purpose, benefits, usual and most frequent risks of, and alternatives to, the operation or procedure have been explained to the patient (or person authorized to sign for the patient) either by me as responsible physician or by the provider who is to perform the operation or procedure. Time spent such that the patient/family has had an opportunity to ask questions, and that those questions have been answered. The patient or  the patient's representative has been advised that selected tasks may be performed by assistants to the primary health care provider(s). I believe that the patient (or person authorized to sign for the patient) understands what has been explained, and has consented to the operation or procedure. No guarantees were implied or made.    Chief Complaint:  Chief Complaint  Patient presents with   Ureteral calculus    History of Present Illness:  Travis Paul is a 56 y.o. year old male who is seen for further evaluation of lower urinary tract symptoms, gross hematuria,and ureteral calculus.    At his initial visit in February 2023, he reported a recent episode of inability to void for approximately 8 hours.  When he was able to void, he noted specks of blood in the urine.  Urinalysis from 10/16/2021 showed 2+ blood and positive leukocytes.  A urine culture grew E. coli.  He was initially treated with Bactrim and was changed to Macrobid.  He reported a strong urine odor.  He reported baseline symptoms of urinary frequency, urgency, and nocturia up to 5-6 times per night, intermittent stream and some straining to void.  He has had UTIs at least once per year. No history of kidney stones.  He has a history of microscopic hematuria.  He has previously been evaluated with CT imaging and cystoscopy which were negative by report.  He also has a history of prostatitis.  No recent PSA results.  He was started on alfuzosin at his visit in 2/23. PVR = 8 mL. He continued on alfuzosin with some improvement in his lower urinary tract symptoms.  He continued to have nocturia x 6 at times and occasional dysuria.  No further gross hematuria. IPSS = 14. Urine culture from 3/23 showed no growth.  CT hematuria protocol from 01/03/2022 showed a 4 mm calculus in the lower pole of the left kidney and mild right ureterectasis with a 4 mm calculus in the distal right ureter.  At his visit on 01/06/2022, he was not  having any dysuria or gross hematuria.  No flank pain.  No fevers or chills.  He continued to have frequency, urgency, and nocturia. Urine culture grew >100 K E. coli.  He was treated with Macrobid x10 days.  He was also started on daily Macrobid for UTI prevention.  He was continued on alfuzosin. 24-hour voiding diary indicated significant nocturnal polyuria with nighttime outputs of 68%, 88%, and 74% of total volume.  At his visit on 01/29/22, he continued on alfuzosin and daily Macrobid.  He continued to report frequency, nocturia, and incomplete emptying.  No gross hematuria.  He also reported occasional dysuria.  No flank pain.  He was not aware of passing a stone. IPSS = 11.  PVR = 40 ml. Cystoscopy was postponed due to an abnormal urinalysis and possible UTI.  He was treated with Levaquin x7 days.  Urine culture grew <10K colonies. Cystoscopy from 6/23 demonstrated lateral lobe enlargement of the prostate, no bladder abnormalities.  Urine cytology was negative for malignancy. He continued on alfuzosin.  He reported sensation of incomplete emptying, frequency, intermittent stream, and nocturia x 2.  His LUTS were slightly improved.  He was not aware of passing a stone.   IPSS = 18.  KUB from 02/12/2022 showed a probable 5 mm calculus in the area of the right distal ureter and a 3 mm left lower pole calculus.  He returns today for follow-up.  He continues on alfuzosin.  His urinary symptoms are stable.  He continues to have some frequency, intermittent stream, and occasional dysuria.  No gross hematuria.  He is not aware of passing a stone.  He has noted some occasional discomfort in the right flank area.  No fevers or chills.  Portions of the above documentation were copied from a prior visit for review purposes only.    Past Medical History:  Past Medical History:  Diagnosis Date   Achilles tendinitis, right leg    Chronic systolic heart failure (HCC)    Diabetes mellitus without  complication (HCC)    Hyperlipidemia    Hypertension    Idiopathic gout, unspecified site    Mallet finger of left hand    Old myocardial infarction     Past Surgical History:  Past Surgical History:  Procedure Laterality Date   COLONOSCOPY N/A 07/21/2019   Procedure: COLONOSCOPY;  Surgeon: Corbin Ade, MD;  Location: [facility];  Service: Endoscopy;  Laterality: N/A;  9:30   NO PAST SURGERIES     POLYPECTOMY  07/21/2019   Procedure: POLYPECTOMY;  Surgeon: Corbin Ade, MD;  Location: [facility];  Service: Endoscopy;;  colon    Allergies:  No Known Allergies  Family History:  Family History  Problem Relation Age of Onset   Liver cancer Mother    Colon polyps Paternal Uncle     Social History:  Social History   Tobacco Use   Smoking status: Former    Packs/day: 1.00    Types: Cigarettes    Quit date: 2012    Years since quitting: 11.5   Smokeless tobacco: Never  Vaping Use   Vaping Use: Never used  Substance Use Topics   Alcohol use: Yes    Comment: 1 beer on weekends   Drug use: Never    ROS: Constitutional:  Negative for fever, chills, weight loss CV: Negative for chest pain, previous MI, hypertension Respiratory:  Negative for shortness of breath, wheezing, sleep apnea, frequent cough GI:  Negative for nausea, vomiting, bloody stool, GERD  Physical exam: BP (!) 155/93   Pulse 64  GENERAL APPEARANCE:  Well appearing, well developed, well nourished, NAD HEENT:  Atraumatic, normocephalic, oropharynx clear NECK:  Supple without lymphadenopathy or thyromegaly ABDOMEN:  Soft, non-tender, no masses EXTREMITIES:  Moves all extremities well, without clubbing, cyanosis, or edema NEUROLOGIC:  Alert and oriented x 3, normal gait, CN II-XII grossly intact MENTAL STATUS:  appropriate BACK:  Non-tender to palpation, No CVAT SKIN:  Warm, dry, and intact  Results: U/A: 2+ protein

## 2022-03-26 ENCOUNTER — Ambulatory Visit: Payer: 59 | Admitting: Urology

## 2022-03-26 LAB — BASIC METABOLIC PANEL
BUN/Creatinine Ratio: 10 (ref 9–20)
BUN: 11 mg/dL (ref 6–24)
CO2: 22 mmol/L (ref 20–29)
Calcium: 9.7 mg/dL (ref 8.7–10.2)
Chloride: 102 mmol/L (ref 96–106)
Creatinine, Ser: 1.11 mg/dL (ref 0.76–1.27)
Glucose: 147 mg/dL — ABNORMAL HIGH (ref 70–99)
Potassium: 4.1 mmol/L (ref 3.5–5.2)
Sodium: 140 mmol/L (ref 134–144)
eGFR: 77 mL/min/{1.73_m2} (ref >59)

## 2022-03-26 LAB — PSA: Prostate Specific Ag, Serum: 0.5 ng/mL (ref 0.0–4.0)

## 2022-04-10 NOTE — Progress Notes (Unsigned)
Cardiology Office Note:    Date:  04/15/2022   ID:  Travis Paul, DOB 11/06/1965, MRN [id number]  PCP:  Juliette Alcide, MD   [facility] Providers Cardiologist:  None {    Referring MD: Juliette Alcide, MD     History of Present Illness:    Travis Paul is a 56 y.o. male with a hx of CAD, HTN, HLD who was previously followed by Dr. Purvis Sheffield who now returns to clinic for follow-up.  Per review of the record, patient had nuclear stress test [facility] 01/04/2017 showed prior inferior wall infarct with no evidence of ischemia, there was inferior wall hypokinesis, LVEF 49%. Echo on 02/10/2017 demonstrated vigorous left ventricular systolic function, LVEF 70 to 67%, mild LVH, G1 DD.  Was seen by Rennis Harding on 06/2020 with continued mild chest pain that was constant in nature. Myoview was obtained and was low risk with no ischemia.  Repeat TTE in 06/2020 with EF >75%, G1DD, normal RV, no significant valve disease.  Was last seen in clinic on 09/2021 where he continued to have atypical chest pain. Otherwise was doing well.   Today, the patient continues to have intermittent right sided chest pain. Feels like gas/fullness in the chest. Sometimes improves with belching. Symptoms not worsened by exertion.   Also notes he has been having dyspnea on exertion and was told he "had something in his lungs" on CT chest on 01/2022. Review of the CT scan showed a 10.59mm nodule and he is planned for repeat imaging today to reassess the nodule. We discussed that dyspnea could be related to heart artery disease (although I did not see any significant coronary calcium on his CT chest) but he would like to have his lungs worked up first. We can bring him back in 1 month to reassess.   Otherwise no LE edema, orthopnea, PND, palpitations, dizziness, syncope. Does not monitor blood pressure at home. Compliant with his medications.  Past Medical History:  Diagnosis Date   Achilles  tendinitis, right leg    Chronic systolic heart failure (HCC)    Diabetes mellitus without complication (HCC)    Hyperlipidemia    Hypertension    Idiopathic gout, unspecified site    Mallet finger of left hand    Old myocardial infarction     Past Surgical History:  Procedure Laterality Date   COLONOSCOPY N/A 07/21/2019   Procedure: COLONOSCOPY;  Surgeon: Corbin Ade, MD;  [facility]: [facility];  Service: Endoscopy;  Laterality: N/A;  9:30   NO PAST SURGERIES     POLYPECTOMY  07/21/2019   Procedure: POLYPECTOMY;  Surgeon: Corbin Ade, MD;  Location: [facility];  Service: Endoscopy;;  colon    Current Medications: Current Meds  Medication Sig   alfuzosin (UROXATRAL) 10 MG 24 hr tablet Take 1 tablet (10 mg total) by mouth daily with breakfast.   amLODipine (NORVASC) 10 MG tablet Take 1 tablet (10 mg total) by mouth daily.   aspirin EC 81 MG tablet Take 1 tablet (81 mg total) by mouth daily.   benazepril (LOTENSIN) 40 MG tablet Take 1 tablet (40 mg total) by mouth daily.   Biotin w/ Vitamins C & E (HAIR/SKIN/NAILS PO) Take 1 tablet by mouth daily.   fenofibrate 160 MG tablet Take 1 tablet (160 mg total) by mouth daily.   isosorbide mononitrate (IMDUR) 30 MG 24 hr tablet Take 1 tablet by mouth once daily   metFORMIN (GLUCOPHAGE) 1000 MG tablet  Take 1,000 mg by mouth 2 (two) times daily with a meal.    Multiple Vitamin (MULTIVITAMIN WITH MINERALS) TABS tablet Take 1 tablet by mouth daily.   nitrofurantoin, macrocrystal-monohydrate, (MACROBID) 100 MG capsule Take 100 mg by mouth daily.   nitroGLYCERIN (NITROSTAT) 0.4 MG SL tablet Place 1 tablet (0.4 mg total) under the tongue every 5 (five) minutes as needed for chest pain.   rosuvastatin (CRESTOR) 10 MG tablet Take 1 tablet (10 mg total) by mouth daily.     Allergies:   Patient has no known allergies.   Social History   Socioeconomic History   Marital status: Single    Spouse name: Not on file   Number of children:  Not on file   Years of education: Not on file   Highest education level: Not on file  Occupational History   Not on file  Tobacco Use   Smoking status: Former    Packs/day: 1.00    Types: Cigarettes    Quit date: 2012    Years since quitting: 11.5   Smokeless tobacco: Never  Vaping Use   Vaping Use: Never used  Substance and Sexual Activity   Alcohol use: Yes    Comment: 1 beer on weekends   Drug use: Never   Sexual activity: Not on file  Other Topics Concern   Not on file  Social History Narrative   Not on file   Social Determinants of Health   Financial Resource Strain: Not on file  Food Insecurity: Not on file  Transportation Needs: Not on file  Physical Activity: Not on file  Stress: Not on file  Social Connections: Not on file     Family History: The patient's family history includes Colon polyps in his paternal uncle; Liver cancer in his mother.  ROS:   Please see the history of present illness.    All other systems reviewed and are negative.  EKGs/Labs/Other Studies Reviewed:    The following studies were reviewed today: TTE 07/03/2020: IMPRESSIONS     1. Left ventricular ejection fraction, by estimation, is >75%. The left  ventricle has hyperdynamic function. The left ventricle has no regional  wall motion abnormalities. Left ventricular diastolic parameters are  consistent with Grade I diastolic  dysfunction (impaired relaxation).   2. Right ventricular systolic function is normal. The right ventricular  size is normal.   3. The mitral valve is normal in structure. No evidence of mitral valve  regurgitation. No evidence of mitral stenosis.   4. The aortic valve is tricuspid. Aortic valve regurgitation is not  visualized. No aortic stenosis is present.   5. The inferior vena cava is normal in size with greater than 50%  respiratory variability, suggesting right atrial pressure of 3 mmHg.   Comparison(s): Echocardiogram done 01/13/17 showed an EF of  70%.  Myoview July 03, 2020: No diagnostic ST segment changes to indicate ischemia. Small, moderate intensity, fixed inferior defect most prominent on rest imaging and in the mid to apical region. Gated imaging indicates normal wall motion suggesting soft tissue attenuation, although scar cannot be completely excluded. No definite ischemia. This is a low risk study. Nuclear stress EF: 62%.  EKG:  No new ECG today  Recent Labs: 03/25/2022: BUN 11; Creatinine, Ser 1.11; Potassium 4.1; Sodium 140  Recent Lipid Panel No results found for: "CHOL", "TRIG", "HDL", "CHOLHDL", "VLDL", "LDLCALC", "LDLDIRECT"   Risk Assessment/Calculations:           Physical Exam:    VS:  BP Marland Kitchen)  134/96   Pulse 90   Ht 6' (1.829 m)   Wt 237 lb 12.8 oz (107.9 kg)   SpO2 97%   BMI 32.25 kg/m     Wt Readings from Last 3 Encounters:  04/15/22 237 lb 12.8 oz (107.9 kg)  11/18/21 236 lb (107 kg)  10/21/21 234 lb (106.1 kg)     GEN:  Well nourished, well developed in no acute distress HEENT: Normal NECK: No JVD; No carotid bruits CARDIAC: RRR, no murmurs, rubs, gallops RESPIRATORY:  CTAB, no wheezes ABDOMEN: Soft, non-tender, non-distended MUSCULOSKELETAL:  No edema; No deformity  SKIN: Warm and dry NEUROLOGIC:  Alert and oriented x 3 PSYCHIATRIC:  Normal affect   ASSESSMENT:    1. SOB (shortness of breath)   2. Chest pain, unspecified type   3. Centrilobular emphysema (HCC)   4. Essential hypertension   5. Mixed hyperlipidemia   6. Diabetes mellitus with coincident hypertension (HCC)     PLAN:    In order of problems listed above:  #Dyspnea on Exertion: #Atypical Chest Pain: Patient has history of right sided atypical chest pain that does not sound cardiac in nature but now reports new worsening dyspnea on exertion which is more concerning. TTE 2021 with EF >75%, G1DD, no significant valve disease. Last stress test in 2021 with no ischemia. Review of CT chest did not reveal any significant  coronary calcification, however, patient certainly has risk factors for CAD. Discussed pursuing coronary CTA but patient would like to wait until he has further work-up of his pulmonary nodule prior to proceeding with cardiac work-up at this time. Will see back in 1 month to discuss further. -Declined coronary CTA at this time as he would like to have his pulmonary nodule/lung work-up first -Will see back in 1 month to reassess and consider CTA coronaries at that time  #HTN: -Continue amlodipine 10mg  daily -Continue benzapril 40mg  daily -Continue imdur 30mg  daily -Monitor blood pressures at home and bring in log for to review  #HLD: -Continue crestor 10mg  daily -Continue fenofibrate 160mg  daily -Monitored by PCP  #DMII: -On metformin, follow-up with PCP as schedule  #10.64mm Pulmonary Nodule: #COPD: #Prior heavy tobacco use: -Planned for CT scan for monitor of 10.35mm nodule -Follow-up with PCP      Medication Adjustments/Labs and Tests Ordered: Current medicines are reviewed at length with the patient today.  Concerns regarding medicines are outlined above.  No orders of the defined types were placed in this encounter.  No orders of the defined types were placed in this encounter.   Patient Instructions  Medication Instructions:  Your physician recommends that you continue on your current medications as directed. Please refer to the Current Medication list given to you today.   Labwork: None  Testing/Procedures: None  Follow-Up: Follow up with [place], PA-C in 1 month.   Any Other Special Instructions Will Be Listed Below (If Applicable).     If you need a refill on your cardiac medications before your next appointment, please call your pharmacy.    Signed, , MD  04/15/2022 2:22 PM    [facility]

## 2022-04-15 ENCOUNTER — Ambulatory Visit (HOSPITAL_COMMUNITY)
Admission: RE | Admit: 2022-04-15 | Discharge: 2022-04-15 | Disposition: A | Discharge status: Home / Self Care | Payer: 59 | Source: Ambulatory Visit | Attending: Family Medicine | Admitting: Family Medicine

## 2022-04-15 ENCOUNTER — Other Ambulatory Visit: Payer: Self-pay

## 2022-04-15 ENCOUNTER — Ambulatory Visit (INDEPENDENT_AMBULATORY_CARE_PROVIDER_SITE_OTHER): Payer: 59 | Admitting: Cardiology

## 2022-04-15 ENCOUNTER — Encounter (HOSPITAL_COMMUNITY): Payer: Self-pay

## 2022-04-15 ENCOUNTER — Ambulatory Visit (HOSPITAL_COMMUNITY): Admission: RE | Admit: 2022-04-15 | Payer: 59 | Source: Ambulatory Visit

## 2022-04-15 ENCOUNTER — Other Ambulatory Visit (HOSPITAL_COMMUNITY): Payer: Self-pay | Admitting: Family Medicine

## 2022-04-15 ENCOUNTER — Encounter: Payer: Self-pay | Admitting: Cardiology

## 2022-04-15 VITALS — BP 134/96 | HR 90 | Ht 72.0 in | Wt 237.8 lb

## 2022-04-15 DIAGNOSIS — I10 Essential (primary) hypertension: Secondary | ICD-10-CM

## 2022-04-15 DIAGNOSIS — J432 Centrilobular emphysema: Secondary | ICD-10-CM | POA: Diagnosis not present

## 2022-04-15 DIAGNOSIS — E782 Mixed hyperlipidemia: Secondary | ICD-10-CM | POA: Diagnosis not present

## 2022-04-15 DIAGNOSIS — R079 Chest pain, unspecified: Secondary | ICD-10-CM

## 2022-04-15 DIAGNOSIS — N201 Calculus of ureter: Secondary | ICD-10-CM

## 2022-04-15 DIAGNOSIS — R0602 Shortness of breath: Secondary | ICD-10-CM

## 2022-04-15 DIAGNOSIS — E119 Type 2 diabetes mellitus without complications: Secondary | ICD-10-CM

## 2022-04-15 DIAGNOSIS — R911 Solitary pulmonary nodule: Secondary | ICD-10-CM

## 2022-04-15 DIAGNOSIS — J439 Emphysema, unspecified: Secondary | ICD-10-CM | POA: Diagnosis not present

## 2022-04-15 NOTE — Patient Instructions (Signed)
Medication Instructions:  Your physician recommends that you continue on your current medications as directed. Please refer to the Current Medication list given to you today.   Labwork: None  Testing/Procedures: None  Follow-Up: Follow up with Randall An, PA-C in 1 month.   Any Other Special Instructions Will Be Listed Below (If Applicable).     If you need a refill on your cardiac medications before your next appointment, please call your pharmacy.

## 2022-04-21 NOTE — Pre-Procedure Instructions (Signed)
Amanda Lee, RN messaged for orders. 

## 2022-04-23 ENCOUNTER — Encounter (HOSPITAL_COMMUNITY): Payer: Self-pay

## 2022-04-23 ENCOUNTER — Other Ambulatory Visit: Payer: Self-pay

## 2022-04-23 ENCOUNTER — Encounter (HOSPITAL_COMMUNITY)
Admission: RE | Admit: 2022-04-23 | Discharge: 2022-04-23 | Disposition: A | Discharge status: Home / Self Care | Payer: 59 | Source: Ambulatory Visit | Attending: Urology | Admitting: Urology

## 2022-04-23 NOTE — Pre-Procedure Instructions (Signed)
Attempted pre-op phone call. Left Voicemail for patient to all back.

## 2022-04-28 ENCOUNTER — Ambulatory Visit (HOSPITAL_COMMUNITY)
Admission: RE | Admit: 2022-04-28 | Discharge: 2022-04-28 | Disposition: A | Discharge status: Home / Self Care | Payer: 59 | Source: Home / Self Care | Attending: Urology | Admitting: Urology

## 2022-04-28 ENCOUNTER — Encounter (HOSPITAL_COMMUNITY)
Admission: RE | Disposition: A | Discharge status: Home / Self Care | Payer: Self-pay | Source: Home / Self Care | Attending: Urology

## 2022-04-28 ENCOUNTER — Ambulatory Visit (HOSPITAL_COMMUNITY)
Admission: RE | Admit: 2022-04-28 | Discharge: 2022-04-28 | Disposition: A | Discharge status: Home / Self Care | Payer: 59 | Attending: Urology | Admitting: Urology

## 2022-04-28 DIAGNOSIS — Z538 Procedure and treatment not carried out for other reasons: Secondary | ICD-10-CM | POA: Diagnosis not present

## 2022-04-28 DIAGNOSIS — M545 Low back pain, unspecified: Secondary | ICD-10-CM | POA: Diagnosis not present

## 2022-04-28 DIAGNOSIS — R109 Unspecified abdominal pain: Secondary | ICD-10-CM | POA: Diagnosis not present

## 2022-04-28 DIAGNOSIS — N201 Calculus of ureter: Secondary | ICD-10-CM

## 2022-04-28 DIAGNOSIS — E119 Type 2 diabetes mellitus without complications: Secondary | ICD-10-CM | POA: Insufficient documentation

## 2022-04-28 DIAGNOSIS — Z01818 Encounter for other preprocedural examination: Secondary | ICD-10-CM | POA: Diagnosis not present

## 2022-04-28 LAB — GLUCOSE, CAPILLARY: Glucose-Capillary: 123 mg/dL — ABNORMAL HIGH (ref 70–99)

## 2022-04-28 SURGERY — LITHOTRIPSY, ESWL
Anesthesia: LOCAL | Laterality: Right

## 2022-04-28 MED ORDER — DIPHENHYDRAMINE HCL 25 MG PO CAPS
ORAL_CAPSULE | ORAL | Status: AC
  Administered 2022-04-28: 25 mg via ORAL
  Filled 2022-04-28: qty 1

## 2022-04-28 MED ORDER — DIAZEPAM 5 MG PO TABS: 10.0000 mg | ORAL_TABLET | Freq: Once | ORAL | Status: AC

## 2022-04-28 MED ORDER — DIAZEPAM 5 MG PO TABS
ORAL_TABLET | ORAL | Status: AC
  Administered 2022-04-28: 10 mg via ORAL
  Filled 2022-04-28: qty 2

## 2022-04-28 MED ORDER — SODIUM CHLORIDE 0.9 % IV SOLN: INTRAVENOUS | Status: DC

## 2022-04-28 MED ORDER — DIPHENHYDRAMINE HCL 25 MG PO CAPS: 25.0000 mg | ORAL_CAPSULE | ORAL | Status: AC

## 2022-04-28 NOTE — H&P (Signed)
Assessment: 1. Ureteral calculus, right   2. BPH with obstruction/lower urinary tract symptoms   3. History of gross hematuria   4. History of UTI         Plan:  Continue alfuzosin 10 mg daily.   Strain urine. KUB today at [facility] for evaluation of right ureteral calculus PSA, BMP today He has not passed the right ureteral calculus.  Options for management of the right ureteral calculus discussed including shockwave lithotripsy and ureteroscopic stone manipulation.  Each procedure including potential risk reviewed.  He would like to proceed with shockwave lithotripsy pending results of KUB today.   Addendum: KUB from 03/25/22 showed the 4 x 2 mm calcification in the right pelvis remains and is suspicious for a persistent distal right ureteral calculus. I discussed these findings with the patient.  He would like to proceed with shockwave lithotripsy.   The patient will be scheduled for Right ESL at [facility].  Surgical request is placed with the surgery schedulers and will be scheduled at the patient's/family request. Informed consent is given as documented below. Anesthesia:  local with IV sedation   The patient does not have sleep apnea, history of MRSA, history of VRE, history of cardiac device requiring special anesthetic needs. Patient is stable and considered clear for surgical in an outpatient ambulatory surgery setting as well as patient hospital setting.   Consent for Operation or Procedure: Provider Certification I hereby certify that the nature, purpose, benefits, usual and most frequent risks of, and alternatives to, the operation or procedure have been explained to the patient (or person authorized to sign for the patient) either by me as responsible physician or by the provider who is to perform the operation or procedure. Time spent such that the patient/family has had an opportunity to ask questions, and that those questions have been answered. The patient or the patient's  representative has been advised that selected tasks may be performed by assistants to the primary health care provider(s). I believe that the patient (or person authorized to sign for the patient) understands what has been explained, and has consented to the operation or procedure. No guarantees were implied or made.       Chief Complaint:     Chief Complaint  Patient presents with   Ureteral calculus      History of Present Illness:   Travis Paul is a 56 y.o. year old male who is seen for further evaluation of lower urinary tract symptoms, gross hematuria,and ureteral calculus.    At his initial visit in February 2023, he reported a recent episode of inability to void for approximately 8 hours.  When he was able to void, he noted specks of blood in the urine.  Urinalysis from 10/16/2021 showed 2+ blood and positive leukocytes.  A urine culture grew E. coli.  He was initially treated with Bactrim and was changed to Macrobid.  He reported a strong urine odor.  He reported baseline symptoms of urinary frequency, urgency, and nocturia up to 5-6 times per night, intermittent stream and some straining to void.  He has had UTIs at least once per year. No history of kidney stones.   He has a history of microscopic hematuria.  He has previously been evaluated with CT imaging and cystoscopy which were negative by report.  He also has a history of prostatitis.   No recent PSA results.   He was started on alfuzosin at his visit in 2/23. PVR = 8 mL.  He continued on alfuzosin with some improvement in his lower urinary tract symptoms.  He continued to have nocturia x 6 at times and occasional dysuria.  No further gross hematuria. IPSS = 14. Urine culture from 3/23 showed no growth.   CT hematuria protocol from 01/03/2022 showed a 4 mm calculus in the lower pole of the left kidney and mild right ureterectasis with a 4 mm calculus in the distal right ureter.   At his visit on 01/06/2022, he was not  having any dysuria or gross hematuria.  No flank pain.  No fevers or chills.  He continued to have frequency, urgency, and nocturia. Urine culture grew >100 K E. coli.  He was treated with Macrobid x10 days.  He was also started on daily Macrobid for UTI prevention.  He was continued on alfuzosin. 24-hour voiding diary indicated significant nocturnal polyuria with nighttime outputs of 68%, 88%, and 74% of total volume.   At his visit on 01/29/22, he continued on alfuzosin and daily Macrobid.  He continued to report frequency, nocturia, and incomplete emptying.  No gross hematuria.  He also reported occasional dysuria.  No flank pain.  He was not aware of passing a stone. IPSS = 11.  PVR = 40 ml. Cystoscopy was postponed due to an abnormal urinalysis and possible UTI.  He was treated with Levaquin x7 days.  Urine culture grew <10K colonies. Cystoscopy from 6/23 demonstrated lateral lobe enlargement of the prostate, no bladder abnormalities.  Urine cytology was negative for malignancy. He continued on alfuzosin.  He reported sensation of incomplete emptying, frequency, intermittent stream, and nocturia x 2.  His LUTS were slightly improved.  He was not aware of passing a stone.   IPSS = 18.   KUB from 02/12/2022 showed a probable 5 mm calculus in the area of the right distal ureter and a 3 mm left lower pole calculus.   He continued on alfuzosin.  His urinary symptoms are stable.  He continues to have some frequency, intermittent stream, and occasional dysuria.  No gross hematuria.  He is not aware of passing a stone.  He has noted some occasional discomfort in the right flank area.  No fevers or chills.   Portions of the above documentation were copied from a prior visit for review purposes only.     Past Medical History:      Past Medical History:  Diagnosis Date   Achilles tendinitis, right leg     Chronic systolic heart failure (HCC)     Diabetes mellitus without complication (HCC)      Hyperlipidemia     Hypertension     Idiopathic gout, unspecified site     Mallet finger of left hand     Old myocardial infarction        Past Surgical History:       Past Surgical History:  Procedure Laterality Date   COLONOSCOPY N/A 07/21/2019    Procedure: COLONOSCOPY;  Surgeon: Corbin Ade, MD;  [facility];  Service: Endoscopy;  Laterality: N/A;  9:30   NO PAST SURGERIES       POLYPECTOMY   07/21/2019    Procedure: POLYPECTOMY;  Surgeon: Corbin Ade, MD;  [facility]: [facility];  Service: Endoscopy;;  colon      Allergies:  No Known Allergies   Family History:       Family History  Problem Relation Age of Onset   Liver cancer Mother     Colon polyps  Paternal Uncle        Social History:  Social History         Tobacco Use   Smoking status: Former      Packs/day: 1.00      Types: Cigarettes      Quit date: 2012      Years since quitting: 11.5   Smokeless tobacco: Never  Vaping Use   Vaping Use: Never used  Substance Use Topics   Alcohol use: Yes      Comment: 1 beer on weekends   Drug use: Never      ROS: Constitutional:  Negative for fever, chills, weight loss CV: Negative for chest pain, previous MI, hypertension Respiratory:  Negative for shortness of breath, wheezing, sleep apnea, frequent cough GI:  Negative for nausea, vomiting, bloody stool, GERD   Physical exam: GENERAL APPEARANCE:  Well appearing, well developed, well nourished, NAD HEENT:  Atraumatic, normocephalic, oropharynx clear NECK:  Supple without lymphadenopathy or thyromegaly ABDOMEN:  Soft, non-tender, no masses EXTREMITIES:  Moves all extremities well, without clubbing, cyanosis, or edema NEUROLOGIC:  Alert and oriented x 3, normal gait, CN II-XII grossly intact MENTAL STATUS:  appropriate BACK:  Non-tender to palpation, No CVAT SKIN:  Warm, dry, and intact   Results: None

## 2022-04-28 NOTE — Interval H&P Note (Signed)
History and Physical Interval Note:  04/28/2022 7:38 AM  Travis Paul  has presented today for surgery, with the diagnosis of right ureteral calculus.  The various methods of treatment have been discussed with the patient and family. After consideration of risks, benefits and other options for treatment, the patient has consented to  Procedure(s): EXTRACORPOREAL SHOCK WAVE LITHOTRIPSY (ESWL) (Right) as a surgical intervention.  The patient's history has been reviewed, patient examined, no change in status, stable for surgery.  I have reviewed the patient's chart and labs.  Questions were answered to the patient's satisfaction.     Di Kindle

## 2022-04-28 NOTE — Progress Notes (Signed)
Unable to do procedure due to passing of stone.

## 2022-04-28 NOTE — Progress Notes (Signed)
KUB from this morning reviewed. The previously seen 2 x 4 mm calcification in the right pelvis was not readily seen.  Patient unaware of stone passage.  No recent flank pain or LUTS.  He had not been straining his urine.  Patient examined with fluoroscopy on lithotripsy unit.  No obvious calcification other than previously noted phleboliths seen.  Procedure was not performed due to inability to visualize the stone.  Findings consistent with spontaneous passage of ureteral calculus.

## 2022-05-13 ENCOUNTER — Ambulatory Visit: Payer: 59 | Admitting: Urology

## 2022-05-26 ENCOUNTER — Ambulatory Visit: Payer: 59 | Admitting: Urology

## 2022-05-26 ENCOUNTER — Ambulatory Visit: Payer: 59 | Admitting: Student

## 2022-05-26 NOTE — Progress Notes (Deleted)
Assessment: 1. Ureteral calculus, right   2. BPH with obstruction/lower urinary tract symptoms   3. History of gross hematuria     Plan:  Continue alfuzosin 10 mg daily.   Strain urine.  Chief Complaint:  No chief complaint on file.   History of Present Illness:  Travis Paul is a 56 y.o. year old male who is seen for further evaluation of lower urinary tract symptoms, gross hematuria,and ureteral calculus.    At his initial visit in February 2023, he reported a recent episode of inability to void for approximately 8 hours.  When he was able to void, he noted specks of blood in the urine.  Urinalysis from 10/16/2021 showed 2+ blood and positive leukocytes.  A urine culture grew E. coli.  He was initially treated with Bactrim and was changed to Macrobid.  He reported a strong urine odor.  He reported baseline symptoms of urinary frequency, urgency, and nocturia up to 5-6 times per night, intermittent stream and some straining to void.  He has had UTIs at least once per year. No history of kidney stones.  He has a history of microscopic hematuria.  He has previously been evaluated with CT imaging and cystoscopy which were negative by report.  He also has a history of prostatitis.  No recent PSA results.  He was started on alfuzosin at his visit in 2/23. PVR = 8 mL. He continued on alfuzosin with some improvement in his lower urinary tract symptoms.  He continued to have nocturia x 6 at times and occasional dysuria.  No further gross hematuria. IPSS = 14. Urine culture from 3/23 showed no growth.  CT hematuria protocol from 01/03/2022 showed a 4 mm calculus in the lower pole of the left kidney and mild right ureterectasis with a 4 mm calculus in the distal right ureter.  At his visit on 01/06/2022, he was not having any dysuria or gross hematuria.  No flank pain.  No fevers or chills.  He continued to have frequency, urgency, and nocturia. Urine culture grew >100 K E. coli.  He  was treated with Macrobid x10 days.  He was also started on daily Macrobid for UTI prevention.  He was continued on alfuzosin. 24-hour voiding diary indicated significant nocturnal polyuria with nighttime outputs of 68%, 88%, and 74% of total volume.  At his visit on 01/29/22, he continued on alfuzosin and daily Macrobid.  He continued to report frequency, nocturia, and incomplete emptying.  No gross hematuria.  He also reported occasional dysuria.  No flank pain.  He was not aware of passing a stone. IPSS = 11.  PVR = 40 ml. Cystoscopy was postponed due to an abnormal urinalysis and possible UTI.  He was treated with Levaquin x7 days.  Urine culture grew <10K colonies. Cystoscopy from 6/23 demonstrated lateral lobe enlargement of the prostate, no bladder abnormalities.  Urine cytology was negative for malignancy. He continued on alfuzosin.  He reported sensation of incomplete emptying, frequency, intermittent stream, and nocturia x 2.  His LUTS were slightly improved.  He was not aware of passing a stone.   IPSS = 18.  KUB from 02/12/2022 showed a probable 5 mm calculus in the area of the right distal ureter and a 3 mm left lower pole calculus. KUB from 03/25/2022 showed a 4 x 2 mm calculus in the right pelvis consistent with the distal ureteral calculus. He was scheduled for right ESL on 04/28/2022 but the stone was not visualized on preoperative  KUB or with fluoroscopy.   PSA 7/23:  0.5   Portions of the above documentation were copied from a prior visit for review purposes only.    Past Medical History:  Past Medical History:  Diagnosis Date   Achilles tendinitis, right leg    Chronic systolic heart failure (HCC)    Diabetes mellitus without complication (HCC)    Hyperlipidemia    Hypertension    Idiopathic gout, unspecified site    Mallet finger of left hand    Old myocardial infarction     Past Surgical History:  Past Surgical History:  Procedure Laterality Date   COLONOSCOPY  N/A 07/21/2019   Procedure: COLONOSCOPY;  Surgeon: Corbin Ade, MD;  Location: [facility];  Service: Endoscopy;  Laterality: N/A;  9:30   NO PAST SURGERIES     POLYPECTOMY  07/21/2019   Procedure: POLYPECTOMY;  Surgeon: Corbin Ade, MD;  Location: [facility];  Service: Endoscopy;;  colon    Allergies:  No Known Allergies  Family History:  Family History  Problem Relation Age of Onset   Liver cancer Mother    Colon polyps Paternal Uncle     Social History:  Social History   Tobacco Use   Smoking status: Former    Packs/day: 1.00    Types: Cigarettes    Quit date: 2012    Years since quitting: 11.7   Smokeless tobacco: Never  Vaping Use   Vaping Use: Never used  Substance Use Topics   Alcohol use: Yes    Comment: 1 beer on weekends   Drug use: Never    ROS: Constitutional:  Negative for fever, chills, weight loss CV: Negative for chest pain, previous MI, hypertension Respiratory:  Negative for shortness of breath, wheezing, sleep apnea, frequent cough GI:  Negative for nausea, vomiting, bloody stool, GERD  Physical exam: There were no vitals taken for this visit. GENERAL APPEARANCE:  Well appearing, well developed, well nourished, NAD HEENT:  Atraumatic, normocephalic, oropharynx clear NECK:  Supple without lymphadenopathy or thyromegaly ABDOMEN:  Soft, non-tender, no masses EXTREMITIES:  Moves all extremities well, without clubbing, cyanosis, or edema NEUROLOGIC:  Alert and oriented x 3, normal gait, CN II-XII grossly intact MENTAL STATUS:  appropriate BACK:  Non-tender to palpation, No CVAT SKIN:  Warm, dry, and intact  Results: U/A:

## 2022-06-09 DIAGNOSIS — I700 Atherosclerosis of aorta: Secondary | ICD-10-CM | POA: Diagnosis not present

## 2022-06-09 DIAGNOSIS — N1330 Unspecified hydronephrosis: Secondary | ICD-10-CM | POA: Diagnosis not present

## 2022-06-09 DIAGNOSIS — E860 Dehydration: Secondary | ICD-10-CM | POA: Diagnosis not present

## 2022-06-09 DIAGNOSIS — R1011 Right upper quadrant pain: Secondary | ICD-10-CM | POA: Diagnosis not present

## 2022-06-09 DIAGNOSIS — E119 Type 2 diabetes mellitus without complications: Secondary | ICD-10-CM | POA: Diagnosis not present

## 2022-06-09 DIAGNOSIS — R112 Nausea with vomiting, unspecified: Secondary | ICD-10-CM | POA: Diagnosis not present

## 2022-06-09 DIAGNOSIS — R109 Unspecified abdominal pain: Secondary | ICD-10-CM | POA: Diagnosis not present

## 2022-06-09 DIAGNOSIS — I10 Essential (primary) hypertension: Secondary | ICD-10-CM | POA: Diagnosis not present

## 2022-06-09 DIAGNOSIS — N132 Hydronephrosis with renal and ureteral calculous obstruction: Secondary | ICD-10-CM | POA: Diagnosis not present

## 2022-06-09 DIAGNOSIS — Z885 Allergy status to narcotic agent status: Secondary | ICD-10-CM | POA: Diagnosis not present

## 2022-06-09 DIAGNOSIS — N201 Calculus of ureter: Secondary | ICD-10-CM | POA: Diagnosis not present

## 2022-06-10 ENCOUNTER — Ambulatory Visit (INDEPENDENT_AMBULATORY_CARE_PROVIDER_SITE_OTHER): Payer: 59 | Admitting: Urology

## 2022-06-10 ENCOUNTER — Encounter: Payer: Self-pay | Admitting: Urology

## 2022-06-10 VITALS — BP 141/90 | HR 88 | Ht 72.0 in | Wt 240.0 lb

## 2022-06-10 DIAGNOSIS — N201 Calculus of ureter: Secondary | ICD-10-CM | POA: Diagnosis not present

## 2022-06-10 DIAGNOSIS — N401 Enlarged prostate with lower urinary tract symptoms: Secondary | ICD-10-CM

## 2022-06-10 DIAGNOSIS — N138 Other obstructive and reflux uropathy: Secondary | ICD-10-CM | POA: Diagnosis not present

## 2022-06-10 LAB — MICROSCOPIC EXAMINATION: Bacteria, UA: NONE SEEN

## 2022-06-10 LAB — URINALYSIS, ROUTINE W REFLEX MICROSCOPIC
Bilirubin, UA: NEGATIVE
Glucose, UA: NEGATIVE
Nitrite, UA: NEGATIVE
Specific Gravity, UA: 1.015 (ref 1.005–1.030)
Urobilinogen, Ur: 0.2 mg/dL (ref 0.2–1.0)
pH, UA: 6 (ref 5.0–7.5)

## 2022-06-10 NOTE — Progress Notes (Signed)
 Assessment: 1. Ureteral calculus, right   2. BPH with obstruction/lower urinary tract symptoms     Plan:  I personally reviewed the records from the ER visit on 06/09/2022 including provider notes, lab results, and imaging results. I personally reviewed the CT study from 06/09/2022 showing a 4 x 6 mm distal right ureteral calculus with associated obstruction. Management options for the distal right ureteral calculus discussed including spontaneous passage, shockwave lithotripsy, and ureteroscopic stone manipulation.  He has been unable to pass the calculus for >5 months.  The stone was not readily visualized on plain film imaging prior to scheduled for lithotripsy last month.  Consequently, I think that proceeding with ureteroscopic stone manipulation would be the best option for management at this point.  Risks, benefits, alternatives were discussed with the patient in detail.  Potential risks including, but not limited to, infection; bleeding;  injury to urethra, bladder, or ureter; possible need of other treatments; possible failure to remove the calculus; ureteral  stricture formation; cardiac, pulmonary, cerebrovascular events; and anesthetic complications were discussed.  The patient understands and wishes to proceed.  Continue alfuzosin 10 mg daily.   Strain urine. Pain medication as needed  The patient will be scheduled for cystoscopy, bilateral retrograde pyelograms, right ureteroscopic laser lithotripsy, stone manipulation, insertion of right ureteral stent at [facility].  Surgical request is placed with the surgery schedulers and will be scheduled at the patient's/family request. Informed consent is given as documented below. Anesthesia: General  The patient does not have sleep apnea, history of MRSA, history of VRE, history of cardiac device requiring special anesthetic needs. Patient is stable and considered clear for surgical in an outpatient ambulatory surgery setting as well as  patient hospital setting.  Consent for Operation or Procedure: Provider Certification I hereby certify that the nature, purpose, benefits, usual and most frequent risks of, and alternatives to, the operation or procedure have been explained to the patient (or person authorized to sign for the patient) either by me as responsible physician or by the provider who is to perform the operation or procedure. Time spent such that the patient/family has had an opportunity to ask questions, and that those questions have been answered. The patient or the patient's representative has been advised that selected tasks may be performed by assistants to the primary health care provider(s). I believe that the patient (or person authorized to sign for the patient) understands what has been explained, and has consented to the operation or procedure. No guarantees were implied or made.  Chief Complaint:  Chief Complaint  Patient presents with   ureteral calclulus    History of Present Illness:  Travis Paul is a 56 y.o. year old male who is seen for further evaluation of a right ureteral calculus. At his initial visit in February 2023, he reported a recent episode of inability to void for approximately 8 hours.  When he was able to void, he noted specks of blood in the urine.  Urinalysis from 10/16/2021 showed 2+ blood and positive leukocytes.  A urine culture grew E. coli.  He was initially treated with Bactrim and was changed to Macrobid.  He reported a strong urine odor.  He reported baseline symptoms of urinary frequency, urgency, and nocturia up to 5-6 times per night, intermittent stream and some straining to void.  He has had UTIs at least once per year. No history of kidney stones.  He has a history of microscopic hematuria.  He has previously been evaluated   with CT imaging and cystoscopy which were negative by report.  He also has a history of prostatitis.   He was started on alfuzosin at his visit in  2/23. PVR = 8 mL. He continued on alfuzosin with some improvement in his lower urinary tract symptoms.  He continued to have nocturia x 6 at times and occasional dysuria.  No further gross hematuria. IPSS = 14. Urine culture from 3/23 showed no growth.  CT hematuria protocol from 01/03/2022 showed a 4 mm calculus in the lower pole of the left kidney and mild right ureterectasis with a 4 mm calculus in the distal right ureter.  At his visit on 01/06/2022, he was not having any dysuria or gross hematuria.  No flank pain.  No fevers or chills.  He continued to have frequency, urgency, and nocturia. Urine culture grew >100 K E. coli.  He was treated with Macrobid x10 days.  He was also started on daily Macrobid for UTI prevention.  He was continued on alfuzosin. 24-hour voiding diary indicated significant nocturnal polyuria with nighttime outputs of 68%, 88%, and 74% of total volume.  At his visit on 01/29/22, he continued on alfuzosin and daily Macrobid.  He continued to report frequency, nocturia, and incomplete emptying.  No gross hematuria.  He also reported occasional dysuria.  No flank pain.  He was not aware of passing a stone. IPSS = 11.  PVR = 40 ml. Cystoscopy was postponed due to an abnormal urinalysis and possible UTI.  He was treated with Levaquin x7 days.  Urine culture grew <10K colonies. Cystoscopy from 6/23 demonstrated lateral lobe enlargement of the prostate, no bladder abnormalities.  Urine cytology was negative for malignancy. He continued on alfuzosin.  He reported sensation of incomplete emptying, frequency, intermittent stream, and nocturia x 2.  His LUTS were slightly improved.  He was not aware of passing a stone.   IPSS = 18.  KUB from 02/12/2022 showed a probable 5 mm calculus in the area of the right distal ureter and a 3 mm left lower pole calculus. KUB from 03/25/2022 showed a 4 x 2 mm calculus in the right pelvis consistent with the distal ureteral calculus. He was  scheduled for right ESL on 04/28/2022 but the stone was not visualized on preoperative KUB or with fluoroscopy.  PSA 7/23:  0.5  He was seen in the emergency room yesterday for evaluation of right upper quadrant abdominal pain with associated nausea and vomiting.  No fever or chills.  CT imaging showed a 4 x 6 mm calculus in the right distal ureter near the UVJ resulting in moderate right hydronephrosis.  His creatinine was slightly elevated at 1.86.  Urinalysis showed 0-5 RBCs, no bacteria.  He continues on alfuzosin.  No dysuria or gross hematuria.  His pain is controlled today. IPSS = 13 today.  Portions of the above documentation were copied from a prior visit for review purposes only.    Past Medical History:  Past Medical History:  Diagnosis Date   Achilles tendinitis, right leg    Chronic systolic heart failure (HCC)    Diabetes mellitus without complication (HCC)    Hyperlipidemia    Hypertension    Idiopathic gout, unspecified site    Mallet finger of left hand    Old myocardial infarction     Past Surgical History:  Past Surgical History:  Procedure Laterality Date   COLONOSCOPY N/A 07/21/2019   Procedure: COLONOSCOPY;  Surgeon: Rourk, Robert M, MD;  Location: [facility];  Service: Endoscopy;  Laterality: N/A;  9:30   NO PAST SURGERIES     POLYPECTOMY  07/21/2019   Procedure: POLYPECTOMY;  Surgeon: Daneil Dolin, MD;  Location: [facility];  Service: Endoscopy;;  colon    Allergies:  Allergies  Allergen Reactions   Morphine And Related Itching    Family History:  Family History  Problem Relation Age of Onset   Liver cancer Mother    Colon polyps Paternal Uncle     Social History:  Social History   Tobacco Use   Smoking status: Former    Packs/day: 1.00    Types: Cigarettes    Quit date: 2012    Years since quitting: 11.7   Smokeless tobacco: Never  Vaping Use   Vaping Use: Never used  Substance Use Topics   Alcohol use: Yes    Comment: 1  beer on weekends   Drug use: Never    ROS: Constitutional:  Negative for fever, chills, weight loss CV: Negative for chest pain, previous MI, hypertension Respiratory:  Negative for shortness of breath, wheezing, sleep apnea, frequent cough GI:  Negative for nausea, vomiting, bloody stool, GERD  Physical exam: BP (!) 141/90   Pulse 88   Ht 6' (1.829 m)   Wt 240 lb (108.9 kg)   BMI 32.55 kg/m  GENERAL APPEARANCE:  Well appearing, well developed, well nourished, NAD HEENT:  Atraumatic, normocephalic, oropharynx clear NECK:  Supple without lymphadenopathy or thyromegaly ABDOMEN:  Soft, non-tender, no masses EXTREMITIES:  Moves all extremities well, without clubbing, cyanosis, or edema NEUROLOGIC:  Alert and oriented x 3, normal gait, CN II-XII grossly intact MENTAL STATUS:  appropriate BACK:  Non-tender to palpation, No CVAT SKIN:  Warm, dry, and intact  Results: U/A:  6-10 WBC, 11-30 RBC

## 2022-06-10 NOTE — H&P (View-Only) (Signed)
Assessment: 1. Ureteral calculus, right   2. BPH with obstruction/lower urinary tract symptoms     Plan:  I personally reviewed the records from the ER visit on 06/09/2022 including provider notes, lab results, and imaging results. I personally reviewed the CT study from 06/09/2022 showing a 4 x 6 mm distal right ureteral calculus with associated obstruction. Management options for the distal right ureteral calculus discussed including spontaneous passage, shockwave lithotripsy, and ureteroscopic stone manipulation.  He has been unable to pass the calculus for >5 months.  The stone was not readily visualized on plain film imaging prior to scheduled for lithotripsy last month.  Consequently, I think that proceeding with ureteroscopic stone manipulation would be the best option for management at this point.  Risks, benefits, alternatives were discussed with the patient in detail.  Potential risks including, but not limited to, infection; bleeding;  injury to urethra, bladder, or ureter; possible need of other treatments; possible failure to remove the calculus; ureteral  stricture formation; cardiac, pulmonary, cerebrovascular events; and anesthetic complications were discussed.  The patient understands and wishes to proceed.  Continue alfuzosin 10 mg daily.   Strain urine. Pain medication as needed  The patient will be scheduled for cystoscopy, bilateral retrograde pyelograms, right ureteroscopic laser lithotripsy, stone manipulation, insertion of right ureteral stent at [facility].  Surgical request is placed with the surgery schedulers and will be scheduled at the patient's/family request. Informed consent is given as documented below. Anesthesia: General  The patient does not have sleep apnea, history of MRSA, history of VRE, history of cardiac device requiring special anesthetic needs. Patient is stable and considered clear for surgical in an outpatient ambulatory surgery setting as well as  patient hospital setting.  Consent for Operation or Procedure: Provider Certification I hereby certify that the nature, purpose, benefits, usual and most frequent risks of, and alternatives to, the operation or procedure have been explained to the patient (or person authorized to sign for the patient) either by me as responsible physician or by the provider who is to perform the operation or procedure. Time spent such that the patient/family has had an opportunity to ask questions, and that those questions have been answered. The patient or the patient's representative has been advised that selected tasks may be performed by assistants to the primary health care provider(s). I believe that the patient (or person authorized to sign for the patient) understands what has been explained, and has consented to the operation or procedure. No guarantees were implied or made.  Chief Complaint:  Chief Complaint  Patient presents with   ureteral calclulus    History of Present Illness:  Travis Paul is a 56 y.o. year old male who is seen for further evaluation of a right ureteral calculus. At his initial visit in February 2023, he reported a recent episode of inability to void for approximately 8 hours.  When he was able to void, he noted specks of blood in the urine.  Urinalysis from 10/16/2021 showed 2+ blood and positive leukocytes.  A urine culture grew E. coli.  He was initially treated with Bactrim and was changed to [place].  He reported a strong urine odor.  He reported baseline symptoms of urinary frequency, urgency, and nocturia up to 5-6 times per night, intermittent stream and some straining to void.  He has had UTIs at least once per year. No history of kidney stones.  He has a history of microscopic hematuria.  He has previously been evaluated  with CT imaging and cystoscopy which were negative by report.  He also has a history of prostatitis.   He was started on alfuzosin at his visit in  2/23. PVR = 8 mL. He continued on alfuzosin with some improvement in his lower urinary tract symptoms.  He continued to have nocturia x 6 at times and occasional dysuria.  No further gross hematuria. IPSS = 14. Urine culture from 3/23 showed no growth.  CT hematuria protocol from 01/03/2022 showed a 4 mm calculus in the lower pole of the left kidney and mild right ureterectasis with a 4 mm calculus in the distal right ureter.  At his visit on 01/06/2022, he was not having any dysuria or gross hematuria.  No flank pain.  No fevers or chills.  He continued to have frequency, urgency, and nocturia. Urine culture grew >100 K E. coli.  He was treated with Macrobid x10 days.  He was also started on daily Macrobid for UTI prevention.  He was continued on alfuzosin. 24-hour voiding diary indicated significant nocturnal polyuria with nighttime outputs of 68%, 88%, and 74% of total volume.  At his visit on 01/29/22, he continued on alfuzosin and daily Macrobid.  He continued to report frequency, nocturia, and incomplete emptying.  No gross hematuria.  He also reported occasional dysuria.  No flank pain.  He was not aware of passing a stone. IPSS = 11.  PVR = 40 ml. Cystoscopy was postponed due to an abnormal urinalysis and possible UTI.  He was treated with Levaquin x7 days.  Urine culture grew <10K colonies. Cystoscopy from 6/23 demonstrated lateral lobe enlargement of the prostate, no bladder abnormalities.  Urine cytology was negative for malignancy. He continued on alfuzosin.  He reported sensation of incomplete emptying, frequency, intermittent stream, and nocturia x 2.  His LUTS were slightly improved.  He was not aware of passing a stone.   IPSS = 18.  KUB from 02/12/2022 showed a probable 5 mm calculus in the area of the right distal ureter and a 3 mm left lower pole calculus. KUB from 03/25/2022 showed a 4 x 2 mm calculus in the right pelvis consistent with the distal ureteral calculus. He was  scheduled for right ESL on 04/28/2022 but the stone was not visualized on preoperative KUB or with fluoroscopy.  PSA 7/23:  0.5  He was seen in the emergency room yesterday for evaluation of right upper quadrant abdominal pain with associated nausea and vomiting.  No fever or chills.  CT imaging showed a 4 x 6 mm calculus in the right distal ureter near the UVJ resulting in moderate right hydronephrosis.  His creatinine was slightly elevated at 1.86.  Urinalysis showed 0-5 RBCs, no bacteria.  He continues on alfuzosin.  No dysuria or gross hematuria.  His pain is controlled today. IPSS = 13 today.  Portions of the above documentation were copied from a prior visit for review purposes only.    Past Medical History:  Past Medical History:  Diagnosis Date   Achilles tendinitis, right leg    Chronic systolic heart failure (HCC)    Diabetes mellitus without complication (HCC)    Hyperlipidemia    Hypertension    Idiopathic gout, unspecified site    Mallet finger of left hand    Old myocardial infarction     Past Surgical History:  Past Surgical History:  Procedure Laterality Date   COLONOSCOPY N/A 07/21/2019   Procedure: COLONOSCOPY;  Surgeon: Daneil Dolin, MD;  Location: [facility];  Service: Endoscopy;  Laterality: N/A;  9:30   NO PAST SURGERIES     POLYPECTOMY  07/21/2019   Procedure: POLYPECTOMY;  Surgeon: Daneil Dolin, MD;  Location: [facility];  Service: Endoscopy;;  colon    Allergies:  Allergies  Allergen Reactions   Morphine And Related Itching    Family History:  Family History  Problem Relation Age of Onset   Liver cancer Mother    Colon polyps Paternal Uncle     Social History:  Social History   Tobacco Use   Smoking status: Former    Packs/day: 1.00    Types: Cigarettes    Quit date: 2012    Years since quitting: 11.7   Smokeless tobacco: Never  Vaping Use   Vaping Use: Never used  Substance Use Topics   Alcohol use: Yes    Comment: 1  beer on weekends   Drug use: Never    ROS: Constitutional:  Negative for fever, chills, weight loss CV: Negative for chest pain, previous MI, hypertension Respiratory:  Negative for shortness of breath, wheezing, sleep apnea, frequent cough GI:  Negative for nausea, vomiting, bloody stool, GERD  Physical exam: BP (!) 141/90   Pulse 88   Ht 6' (1.829 m)   Wt 240 lb (108.9 kg)   BMI 32.55 kg/m  GENERAL APPEARANCE:  Well appearing, well developed, well nourished, NAD HEENT:  Atraumatic, normocephalic, oropharynx clear NECK:  Supple without lymphadenopathy or thyromegaly ABDOMEN:  Soft, non-tender, no masses EXTREMITIES:  Moves all extremities well, without clubbing, cyanosis, or edema NEUROLOGIC:  Alert and oriented x 3, normal gait, CN II-XII grossly intact MENTAL STATUS:  appropriate BACK:  Non-tender to palpation, No CVAT SKIN:  Warm, dry, and intact  Results: U/A:  6-10 WBC, 11-30 RBC

## 2022-06-11 ENCOUNTER — Telehealth: Payer: Self-pay

## 2022-06-11 NOTE — Telephone Encounter (Signed)
Patient left a voicemail stating he had seen a spec of blood in his urine and did not know if it was cause for concern or if it will interfere with his upcoming surgery.  Returned his call to inform him it will not affect his upcoming surgery and there is not cause for concern as long as he has no other symptoms.  Patient voiced understanding.

## 2022-06-12 NOTE — Patient Instructions (Signed)
Travis Paul Evelyn  06/12/2022     @[email or web address] @   Your procedure is scheduled on 06/17/2022.  Report to [facility] at 8:00 A.M.  Call this number if you have problems the morning of surgery:  [phone]   Remember:  Do not eat or drink after midnight.     Take these medicines the morning of surgery with A SIP OF WATER : norvasc  and isosorbide    Do not wear jewelry, make-up or nail polish.  Do not wear lotions, powders, or perfumes, or deodorant.  Do not shave 48 hours prior to surgery.  Men may shave face and neck.  Do not bring valuables to the hospital.  [facility] is not responsible for any belongings or valuables.  Contacts, dentures or bridgework may not be worn into surgery.  Leave your suitcase in the car.  After surgery it may be brought to your room.  For patients admitted to the hospital, discharge time will be determined by your treatment team.  Patients discharged the day of surgery will not be allowed to drive home.   Name and phone number of your driver:   family Special instructions:  N/a  Please read over the following fact sheets that you were given. Care and Recovery After Surgery  Ureteral Stent Implantation Ureteral stent implantation is a procedure to insert (implant) a flexible, soft, plastic tube (stent) into a ureter. Ureters are the tubelike parts of the body that drain urine from the kidneys. A ureteral stent may be implanted: After a procedure to remove a blockage from the ureter (ureterolysis or pyeloplasty). To open the flow of urine when a blockage is caused by a kidney stone, tumor, blood clot, or infection. You have two ureters, one on each side of your body. The ureters connect your kidneys to your bladder. The stent is placed so that one end is in your kidney, and one end is in your bladder. The stent supports the ureter while it heals and helps to drain urine. The stent is usually taken out after your ureter has healed.  Depending on your condition, you may have a stent for just a few weeks, or you may have a long-term stent that will need to be replaced every few months. Tell a health care provider about: Any allergies you have. All medicines you are taking, including vitamins, herbs, eye drops, creams, and over-the-counter medicines. Any problems you or family members have had with anesthetic medicines. Any bleeding problems you have. Any surgeries you have had. Any medical conditions you have. Whether you are pregnant or may be pregnant. What are the risks? Generally, this is a safe procedure. However, problems may occur, including: Infection. Bleeding. Allergic reactions to medicines. Damage to nearby structures or organs, such as tearing (perforation) of the ureter. Movement of the stent away from where it is placed during surgery (migration). Buildup of a crust or hard coating (encrustation) on the stent. This happens when bacteria in the body form crystals on the stent, causing it to weaken. What happens before the procedure? Medicines Ask your health care provider about: Changing or stopping your regular medicines. These include any diabetes medicines or blood thinners you take. Taking medicines such as aspirin and ibuprofen. These medicines can thin your blood. Do not take them unless your health care provider tells you to. Taking over-the-counter medicines, vitamins, herbs, and supplements. When to stop eating and drinking Follow instructions from your health care provider about what you may eat and  drink. These may include: 8 hours before your procedure Stop eating most foods. Do not eat meat, fried foods, or fatty foods. Eat only light foods, such as toast or crackers. All liquids are okay except energy drinks and alcohol. 6 hours before your procedure Stop eating. Drink only clear liquids, such as water, clear fruit juice, black coffee, plain tea, and sports drinks. Do not drink energy  drinks or alcohol. 2 hours before your procedure Stop drinking all liquids. You may be allowed to take medicines with small sips of water. If you do not follow your health care provider's instructions, your procedure may be delayed or canceled. General instructions Do not use any products that contain nicotine or tobacco for at least 4 weeks before the procedure. These products include cigarettes, chewing tobacco, and vaping devices, such as e-cigarettes. If you need help quitting, ask your health care provider. You may have an exam or testing, such as imaging or blood tests. If you will be going home right after the procedure, plan to have a responsible adult: Take you home from the hospital or clinic. You will not be allowed to drive. Care for you for the time you are told. Ask your health care provider what steps will be taken to help prevent infection. These steps may include: Removing hair at the surgery site. Washing skin with a soap that kills germs. Taking antibiotic medicine. What happens during the procedure? An IV will be inserted into one of your veins. You may be given: A medicine to help you relax (sedative). A medicine to make you fall asleep (general anesthetic). A thin, tube-shaped instrument with a light and tiny camera at the end (cystoscope) will be inserted into your urethra. The urethra is the part of your body that drains urine from the bladder. The urethra opens at the end of the penis or in front of the vaginal opening. The cystoscope will be passed into your bladder. Guided imagery using X-ray may be used to pass a thin wire (guide wire) through your bladder and into your ureter. This wire is used to guide the stent into your ureter. The stent will be inserted into your ureter. The guide wire and the cystoscope will be removed. A thin, flexible tube (catheter) may be put through your urethra so that one end is in your bladder. This helps to drain urine from your  bladder. The procedure may vary among hospitals and health care providers. What happens after the procedure? Your blood pressure, heart rate, breathing rate, and blood oxygen level will be monitored until you leave the hospital or clinic. You may continue to get medicine and fluids through an IV. You may have some soreness or pain in your abdomen and urethra. You may be given medicines for this. You will be encouraged to get up and walk around as soon as you can. You may have a catheter draining your urine. Summary Ureteral stent implantation is a procedure to insert a flexible, soft, plastic tube (stent) into a ureter. You may have a stent implanted to support the ureter while it heals after a procedure or to open the flow of urine if there is a blockage. You may have a stent for just a few weeks, or you may have a long-term stent that will need to be replaced every few months. Follow instructions from your health care provider about taking medicines and about eating and drinking before the procedure. This information is not intended to replace advice given to you  by your health care provider. Make sure you discuss any questions you have with your health care provider. Document Revised: 10/06/2021 Document Reviewed: 10/06/2021 Elsevier Patient Education  2023 [employer] Cystoscopy Cystoscopy is a procedure that is used to help diagnose and sometimes treat conditions that affect the lower urinary tract. The lower urinary tract includes the bladder and the urethra. The urethra is the tube that drains urine from the bladder. Cystoscopy is done using a thin, tube-shaped instrument with a light and camera at the end (cystoscope). The cystoscope may be hard or flexible, depending on the goal of the procedure. The cystoscope is inserted through the urethra, into the bladder. Cystoscopy may be recommended if you have: Urinary tract infections that keep coming back. Blood in the urine  (hematuria). An inability to control when you urinate (urinary incontinence) or an overactive bladder. Unusual cells found in a urine sample. A blockage in the urethra, such as a urinary stone. Painful urination. An abnormality in the bladder found during an intravenous pyelogram (IVP) or CT scan. Cystoscopy may also be done to remove a sample of tissue to be examined under a microscope (biopsy). Tell a health care provider about: Any allergies you have. All medicines you are taking, including vitamins, herbs, eye drops, creams, and over-the-counter medicines. Any problems you or family members have had with anesthetic medicines. Any blood disorders you have. Any surgeries you have had. Any medical conditions you have. Whether you are pregnant or may be pregnant. What are the risks? Generally, this is a safe procedure. However, problems may occur, including: Infection. Bleeding. Allergic reactions to medicines. Damage to other structures or organs. What happens before the procedure? Medicines Ask your health care provider about: Changing or stopping your regular medicines. This is especially important if you are taking diabetes medicines or blood thinners. Taking medicines such as aspirin and ibuprofen. These medicines can thin your blood. Do not take these medicines unless your health care provider tells you to take them. Taking over-the-counter medicines, vitamins, herbs, and supplements. Tests You may have an exam or testing, such as: X-rays of the bladder, urethra, or kidneys. CT scan of the abdomen or pelvis. Urine tests to check for signs of infection. General instructions Follow instructions from your health care provider about eating or drinking restrictions. Ask your health care provider what steps will be taken to help prevent infection. These steps may include: Washing skin with a germ-killing soap. Taking antibiotic medicine. Plan to have a responsible adult take you  home from the hospital or clinic. What happens during the procedure?  You will be given one or more of the following: A medicine to help you relax (sedative). A medicine to numb the area (local anesthetic). The area around the opening of your urethra will be cleaned. The cystoscope will be passed through your urethra into your bladder. Germ-free (sterile) fluid will flow through the cystoscope to fill your bladder. The fluid will stretch your bladder so that your health care provider can clearly examine your bladder walls. Your doctor will look at the urethra and bladder. Your doctor may take a biopsy or remove stones. The cystoscope will be removed, and your bladder will be emptied. The procedure may vary among health care providers and hospitals. What can I expect after the procedure? After the procedure, it is common to have: Some soreness or pain in your abdomen and urethra. Urinary symptoms. These include: Mild pain or burning when you urinate. Pain should stop within a few  minutes after you urinate. This may last for up to 1 week. A small amount of blood in your urine for several days. Feeling like you need to urinate but producing only a small amount of urine. Follow these instructions at home: Medicines Take over-the-counter and prescription medicines only as told by your health care provider. If you were prescribed an antibiotic medicine, take it as told by your health care provider. Do not stop taking the antibiotic even if you start to feel better. General instructions Return to your normal activities as told by your health care provider. Ask your health care provider what activities are safe for you. If you were given a sedative during the procedure, it can affect you for several hours. Do not drive or operate machinery until your health care provider says that it is safe. Watch for any blood in your urine. If the amount of blood in your urine increases, call your health care  provider. Follow instructions from your health care provider about eating or drinking restrictions. If a tissue sample was removed for testing (biopsy) during your procedure, it is up to you to get your test results. Ask your health care provider, or the department that is doing the test, when your results will be ready. Drink enough fluid to keep your urine pale yellow. Keep all follow-up visits. This is important. Contact a health care provider if: You have pain that gets worse or does not get better with medicine, especially pain when you urinate. You have trouble urinating. You have more blood in your urine. Get help right away if: You have blood clots in your urine. You have abdominal pain. You have a fever or chills. You are unable to urinate. Summary Cystoscopy is a procedure that is used to help diagnose and sometimes treat conditions that affect the lower urinary tract. Cystoscopy is done using a thin, tube-shaped instrument with a light and camera at the end. After the procedure, it is common to have some soreness or pain in your abdomen and urethra. Watch for any blood in your urine. If the amount of blood in your urine increases, call your health care provider. If you were prescribed an antibiotic medicine, take it as told by your health care provider. Do not stop taking the antibiotic even if you start to feel better. This information is not intended to replace advice given to you by your health care provider. Make sure you discuss any questions you have with your health care provider. Document Revised: 05/14/2021 Document Reviewed: 04/12/2020 Elsevier Patient Education  2023 [employer]  General Anesthesia, Adult General anesthesia is the use of medicine to make you fall asleep (unconscious) for a medical procedure. General anesthesia must be used for certain procedures. It is often recommended for surgery or procedures that: Last a long time. Require you to be still or in  an unusual position. Are major and can cause blood loss. Affect your breathing. The medicines used for general anesthesia are called general anesthetics. During general anesthesia, these medicines are given along with medicines that: Prevent pain. Control your blood pressure. Relax your muscles. Prevent nausea and vomiting after the procedure. Tell a health care provider about: Any allergies you have. All medicines you are taking, including vitamins, herbs, eye drops, creams, and over-the-counter medicines. Your history of any: Medical conditions you have, including: High blood pressure. Bleeding problems. Diabetes. Heart or lung conditions, such as: Heart failure. Sleep apnea. Asthma. Chronic obstructive pulmonary disease (COPD). Current or recent illnesses,  such as: Upper respiratory, chest, or ear infections. Cough or fever. Tobacco or drug use, including marijuana or alcohol use. Depression or anxiety. Surgeries and types of anesthetics you have had. Problems you or family members have had with anesthetic medicines. Whether you are pregnant or may be pregnant. Whether you have any chipped or loose teeth, dentures, caps, bridgework, or issues with your mouth, swallowing, or choking. What are the risks? Your health care provider will talk with you about risks. These may include: Allergic reaction to the medicines. Lung and heart problems. Inhaling food or liquid from the stomach into the lungs (aspiration). Nerve injury. Injury to the lips, mouth, teeth, or gums. Stroke. Waking up during your procedure and being unable to move. This is rare. These problems are more likely to develop if you are having a major surgery or if you have an advanced or serious medical condition. You can prevent some of these complications by answering all of your health care provider's questions thoroughly and by following all instructions before your procedure. General anesthesia can cause side  effects, including: Nausea or vomiting. A sore throat or hoarseness from the breathing tube. Wheezing or coughing. Shaking chills or feeling cold. Body aches. Sleepiness. Confusion, agitation (delirium), or anxiety. What happens before the procedure? When to stop eating and drinking Follow instructions from your health care provider about what you may eat and drink before your procedure. If you do not follow your health care provider's instructions, your procedure may be delayed or canceled. Medicines Ask your health care provider about: Changing or stopping your regular medicines. These include any diabetes medicines or blood thinners you take. Taking medicines such as aspirin and ibuprofen. These medicines can thin your blood. Do not take them unless your health care provider tells you to. Taking over-the-counter medicines, vitamins, herbs, and supplements. General instructions Do not use any products that contain nicotine or tobacco for at least 4 weeks before the procedure. These products include cigarettes, chewing tobacco, and vaping devices, such as e-cigarettes. If you need help quitting, ask your health care provider. If you brush your teeth on the morning of the procedure, make sure to spit out all of the water and toothpaste. If told by your health care provider, bring your sleep apnea device with you to surgery (if applicable). If you will be going home right after the procedure, plan to have a responsible adult: Take you home from the hospital or clinic. You will not be allowed to drive. Care for you for the time you are told. What happens during the procedure?  An IV will be inserted into one of your veins. You will be given one or more of the following through a face mask or IV: A sedative. This helps you relax. Anesthesia. This will: Numb certain areas of your body. Make you fall asleep for surgery. After you are unconscious, a breathing tube may be inserted down your  throat to help you breathe. This will be removed before you wake up. An anesthesia provider, such as an anesthesiologist, will stay with you throughout your procedure. The anesthesia provider will: Keep you comfortable and safe by continuing to give you medicines and adjusting the amount of medicine that you get. Monitor your blood pressure, heart rate, and oxygen levels to make sure that the anesthetics do not cause any problems. The procedure may vary among health care providers and hospitals. What happens after the procedure? Your blood pressure, temperature, heart rate, breathing rate, and blood oxygen level  will be monitored until you leave the hospital or clinic. You will wake up in a recovery area. You may wake up slowly. You may be given medicine to help you with pain, nausea, or any other side effects from the anesthesia. Summary General anesthesia is the use of medicine to make you fall asleep (unconscious) for a medical procedure. Follow your health care provider's instructions about when to stop eating, drinking, or taking certain medicines before your procedure. Plan to have a responsible adult take you home from the hospital or clinic. This information is not intended to replace advice given to you by your health care provider. Make sure you discuss any questions you have with your health care provider. Document Revised: 11/27/2021 Document Reviewed: 11/27/2021 Elsevier Patient Education  2023 [employer].

## 2022-06-15 ENCOUNTER — Encounter (HOSPITAL_COMMUNITY)
Admission: RE | Admit: 2022-06-15 | Discharge: 2022-06-15 | Disposition: A | Discharge status: Home / Self Care | Payer: 59 | Source: Ambulatory Visit | Attending: Urology | Admitting: Urology

## 2022-06-15 ENCOUNTER — Encounter (HOSPITAL_COMMUNITY): Payer: Self-pay

## 2022-06-15 VITALS — BP 139/89 | HR 77 | Temp 97.7°F | Resp 18 | Ht 72.0 in | Wt 240.0 lb

## 2022-06-15 DIAGNOSIS — E0800 Diabetes mellitus due to underlying condition with hyperosmolarity without nonketotic hyperglycemic-hyperosmolar coma (NKHHC): Secondary | ICD-10-CM

## 2022-06-15 DIAGNOSIS — I10 Essential (primary) hypertension: Secondary | ICD-10-CM | POA: Diagnosis not present

## 2022-06-15 DIAGNOSIS — Z01818 Encounter for other preprocedural examination: Secondary | ICD-10-CM | POA: Diagnosis not present

## 2022-06-15 LAB — BASIC METABOLIC PANEL
Anion gap: 10 (ref 5–15)
BUN: 12 mg/dL (ref 6–20)
CO2: 24 mmol/L (ref 22–32)
Calcium: 9 mg/dL (ref 8.9–10.3)
Chloride: 105 mmol/L (ref 98–111)
Creatinine, Ser: 1.11 mg/dL (ref 0.61–1.24)
GFR, Estimated: >60 mL/min (ref >60)
Glucose, Bld: 181 mg/dL — ABNORMAL HIGH (ref 70–99)
Potassium: 3.3 mmol/L — ABNORMAL LOW (ref 3.5–5.1)
Sodium: 139 mmol/L (ref 135–145)

## 2022-06-17 ENCOUNTER — Ambulatory Visit (HOSPITAL_BASED_OUTPATIENT_CLINIC_OR_DEPARTMENT_OTHER): Payer: 59 | Admitting: Anesthesiology

## 2022-06-17 ENCOUNTER — Ambulatory Visit (HOSPITAL_COMMUNITY): Payer: 59 | Admitting: Anesthesiology

## 2022-06-17 ENCOUNTER — Encounter (HOSPITAL_COMMUNITY)
Admission: RE | Disposition: A | Discharge status: Home / Self Care | Payer: Self-pay | Source: Home / Self Care | Attending: Urology

## 2022-06-17 ENCOUNTER — Telehealth: Payer: Self-pay

## 2022-06-17 ENCOUNTER — Encounter (HOSPITAL_COMMUNITY): Payer: Self-pay | Admitting: Urology

## 2022-06-17 ENCOUNTER — Ambulatory Visit (HOSPITAL_COMMUNITY): Payer: 59

## 2022-06-17 ENCOUNTER — Ambulatory Visit (HOSPITAL_COMMUNITY)
Admission: RE | Admit: 2022-06-17 | Discharge: 2022-06-17 | Disposition: A | Discharge status: Home / Self Care | Payer: 59 | Attending: Urology | Admitting: Urology

## 2022-06-17 DIAGNOSIS — Z87891 Personal history of nicotine dependence: Secondary | ICD-10-CM | POA: Insufficient documentation

## 2022-06-17 DIAGNOSIS — Z7984 Long term (current) use of oral hypoglycemic drugs: Secondary | ICD-10-CM | POA: Insufficient documentation

## 2022-06-17 DIAGNOSIS — N138 Other obstructive and reflux uropathy: Secondary | ICD-10-CM | POA: Diagnosis not present

## 2022-06-17 DIAGNOSIS — I2510 Atherosclerotic heart disease of native coronary artery without angina pectoris: Secondary | ICD-10-CM | POA: Diagnosis not present

## 2022-06-17 DIAGNOSIS — N132 Hydronephrosis with renal and ureteral calculous obstruction: Secondary | ICD-10-CM | POA: Diagnosis not present

## 2022-06-17 DIAGNOSIS — I252 Old myocardial infarction: Secondary | ICD-10-CM | POA: Diagnosis not present

## 2022-06-17 DIAGNOSIS — N400 Enlarged prostate without lower urinary tract symptoms: Secondary | ICD-10-CM

## 2022-06-17 DIAGNOSIS — I5022 Chronic systolic (congestive) heart failure: Secondary | ICD-10-CM | POA: Diagnosis not present

## 2022-06-17 DIAGNOSIS — Q625 Duplication of ureter: Secondary | ICD-10-CM

## 2022-06-17 DIAGNOSIS — N200 Calculus of kidney: Secondary | ICD-10-CM | POA: Diagnosis not present

## 2022-06-17 DIAGNOSIS — I10 Essential (primary) hypertension: Secondary | ICD-10-CM

## 2022-06-17 DIAGNOSIS — E119 Type 2 diabetes mellitus without complications: Secondary | ICD-10-CM | POA: Insufficient documentation

## 2022-06-17 DIAGNOSIS — N201 Calculus of ureter: Secondary | ICD-10-CM | POA: Diagnosis not present

## 2022-06-17 DIAGNOSIS — I110 Hypertensive heart disease with heart failure: Secondary | ICD-10-CM | POA: Insufficient documentation

## 2022-06-17 DIAGNOSIS — N401 Enlarged prostate with lower urinary tract symptoms: Secondary | ICD-10-CM | POA: Diagnosis not present

## 2022-06-17 HISTORY — PX: CYSTOSCOPY WITH RETROGRADE PYELOGRAM, URETEROSCOPY AND STENT PLACEMENT: SHX5789

## 2022-06-17 HISTORY — PX: HOLMIUM LASER APPLICATION: SHX5852

## 2022-06-17 LAB — GLUCOSE, CAPILLARY
Glucose-Capillary: 106 mg/dL — ABNORMAL HIGH (ref 70–99)
Glucose-Capillary: 130 mg/dL — ABNORMAL HIGH (ref 70–99)

## 2022-06-17 SURGERY — CYSTOURETEROSCOPY, WITH RETROGRADE PYELOGRAM AND STENT INSERTION
Anesthesia: General | Site: Ureter | Laterality: Right

## 2022-06-17 MED ORDER — CHLORHEXIDINE GLUCONATE 0.12 % MT SOLN
15.0000 mL | Freq: Once | OROMUCOSAL | Status: AC
  Administered 2022-06-17: 15 mL via OROMUCOSAL

## 2022-06-17 MED ORDER — CEFAZOLIN SODIUM-DEXTROSE 2-4 GM/100ML-% IV SOLN
INTRAVENOUS | Status: AC
  Filled 2022-06-17: qty 100

## 2022-06-17 MED ORDER — SEVOFLURANE IN SOLN
RESPIRATORY_TRACT | Status: AC
  Filled 2022-06-17: qty 250

## 2022-06-17 MED ORDER — LIDOCAINE HCL (CARDIAC) PF 100 MG/5ML IV SOSY
PREFILLED_SYRINGE | INTRAVENOUS | Status: DC | PRN
  Administered 2022-06-17: 100 mg via INTRAVENOUS

## 2022-06-17 MED ORDER — PHENAZOPYRIDINE HCL 200 MG PO TABS: 200.0000 mg | ORAL_TABLET | Freq: Three times a day (TID) | ORAL | 0 refills | Status: AC | PRN

## 2022-06-17 MED ORDER — ORAL CARE MOUTH RINSE: 15.0000 mL | Freq: Once | OROMUCOSAL | Status: AC

## 2022-06-17 MED ORDER — HYDROMORPHONE HCL 1 MG/ML IJ SOLN: 0.2500 mg | INTRAMUSCULAR | Status: DC | PRN

## 2022-06-17 MED ORDER — SODIUM CHLORIDE 0.9 % IR SOLN
Status: DC | PRN
  Administered 2022-06-17: 3000 mL

## 2022-06-17 MED ORDER — DEXAMETHASONE SODIUM PHOSPHATE 10 MG/ML IJ SOLN
INTRAMUSCULAR | Status: DC | PRN
  Administered 2022-06-17: 10 mg via INTRAVENOUS

## 2022-06-17 MED ORDER — ONDANSETRON HCL 4 MG/2ML IJ SOLN: 4.0000 mg | Freq: Once | INTRAMUSCULAR | Status: DC | PRN

## 2022-06-17 MED ORDER — CIPROFLOXACIN HCL 500 MG PO TABS: 500.0000 mg | ORAL_TABLET | Freq: Two times a day (BID) | ORAL | 0 refills | Status: AC

## 2022-06-17 MED ORDER — FENTANYL CITRATE (PF) 100 MCG/2ML IJ SOLN
INTRAMUSCULAR | Status: DC | PRN
  Administered 2022-06-17: 100 ug via INTRAVENOUS

## 2022-06-17 MED ORDER — DIATRIZOATE MEGLUMINE 30 % UR SOLN
URETHRAL | Status: DC | PRN
  Administered 2022-06-17: 17 mL via URETHRAL

## 2022-06-17 MED ORDER — MEPERIDINE HCL 50 MG/ML IJ SOLN: 6.2500 mg | INTRAMUSCULAR | Status: DC | PRN

## 2022-06-17 MED ORDER — PROPOFOL 10 MG/ML IV BOLUS
INTRAVENOUS | Status: AC
  Filled 2022-06-17: qty 20

## 2022-06-17 MED ORDER — FENTANYL CITRATE (PF) 100 MCG/2ML IJ SOLN
INTRAMUSCULAR | Status: AC
  Filled 2022-06-17: qty 2

## 2022-06-17 MED ORDER — EPHEDRINE SULFATE (PRESSORS) 50 MG/ML IJ SOLN
INTRAMUSCULAR | Status: DC | PRN
  Administered 2022-06-17 (×2): 10 mg via INTRAVENOUS

## 2022-06-17 MED ORDER — ONDANSETRON HCL 4 MG/2ML IJ SOLN
INTRAMUSCULAR | Status: DC | PRN
  Administered 2022-06-17: 4 mg via INTRAVENOUS

## 2022-06-17 MED ORDER — CEFAZOLIN SODIUM-DEXTROSE 2-4 GM/100ML-% IV SOLN
2.0000 g | INTRAVENOUS | Status: AC
  Administered 2022-06-17: 2 g via INTRAVENOUS

## 2022-06-17 MED ORDER — MIDAZOLAM HCL 5 MG/5ML IJ SOLN
INTRAMUSCULAR | Status: DC | PRN
  Administered 2022-06-17: 2 mg via INTRAVENOUS

## 2022-06-17 MED ORDER — EPHEDRINE 5 MG/ML INJ
INTRAVENOUS | Status: AC
  Filled 2022-06-17: qty 5

## 2022-06-17 MED ORDER — PROPOFOL 10 MG/ML IV BOLUS
INTRAVENOUS | Status: DC | PRN
  Administered 2022-06-17: 200 mg via INTRAVENOUS

## 2022-06-17 MED ORDER — KETOROLAC TROMETHAMINE 30 MG/ML IJ SOLN
INTRAMUSCULAR | Status: AC
  Filled 2022-06-17: qty 1

## 2022-06-17 MED ORDER — MIDAZOLAM HCL 2 MG/2ML IJ SOLN
INTRAMUSCULAR | Status: AC
  Filled 2022-06-17: qty 2

## 2022-06-17 MED ORDER — DIATRIZOATE MEGLUMINE 30 % UR SOLN
URETHRAL | Status: AC
  Filled 2022-06-17: qty 100

## 2022-06-17 MED ORDER — WATER FOR IRRIGATION, STERILE IR SOLN
Status: DC | PRN
  Administered 2022-06-17: 500 mL

## 2022-06-17 MED ORDER — LIDOCAINE HCL URETHRAL/MUCOSAL 2 % EX GEL
CUTANEOUS | Status: AC
  Filled 2022-06-17: qty 10

## 2022-06-17 MED ORDER — KETOROLAC TROMETHAMINE 30 MG/ML IJ SOLN
INTRAMUSCULAR | Status: DC | PRN
  Administered 2022-06-17: 30 mg via INTRAVENOUS

## 2022-06-17 MED ORDER — LACTATED RINGERS IV SOLN: INTRAVENOUS | Status: DC

## 2022-06-17 MED ORDER — LIDOCAINE HCL URETHRAL/MUCOSAL 2 % EX GEL
CUTANEOUS | Status: DC | PRN
  Administered 2022-06-17: 1

## 2022-06-17 SURGICAL SUPPLY — 25 items
BAG DRAIN URO TABLE W/ADPT NS (BAG) ×1 IMPLANT
BAG DRN 8 ADPR NS SKTRN CSTL (BAG) ×1
BAG HAMPER (MISCELLANEOUS) ×1 IMPLANT
CATH URET 5FR 28IN OPEN ENDED (CATHETERS) ×1 IMPLANT
CLOTH BEACON ORANGE TIMEOUT ST (SAFETY) ×1 IMPLANT
COVER MAYO STAND XLG (MISCELLANEOUS) ×1 IMPLANT
EXTRACTOR STONE NITINOL NGAGE (UROLOGICAL SUPPLIES) IMPLANT
GLOVE BIO SURGEON STRL SZ8 (GLOVE) ×1 IMPLANT
GLOVE BIOGEL PI IND STRL 7.0 (GLOVE) ×2 IMPLANT
GOWN STRL REUS W/TWL LRG LVL3 (GOWN DISPOSABLE) ×1 IMPLANT
GOWN STRL REUS W/TWL XL LVL3 (GOWN DISPOSABLE) ×1 IMPLANT
GUIDEWIRE STR DUAL SENSOR (WIRE) ×1 IMPLANT
IV NS IRRIG 3000ML ARTHROMATIC (IV SOLUTION) ×2 IMPLANT
KIT TURNOVER CYSTO (KITS) ×1 IMPLANT
MANIFOLD NEPTUNE II (INSTRUMENTS) ×1 IMPLANT
PACK CYSTO (CUSTOM PROCEDURE TRAY) ×1 IMPLANT
PAD ARMBOARD 7.5X6 YLW CONV (MISCELLANEOUS) ×1 IMPLANT
SHEATH URETERAL FLEX 12X35 (SHEATH) IMPLANT
STENT URET 6FRX28 CONTOUR (STENTS) IMPLANT
SYR 10ML LL (SYRINGE) ×1 IMPLANT
SYR CONTROL 10ML LL (SYRINGE) IMPLANT
TOWEL OR 17X26 4PK STRL BLUE (TOWEL DISPOSABLE) ×1 IMPLANT
TRACTIP FLEXIVA PULS ID 200XHI (Laser) IMPLANT
TRACTIP FLEXIVA PULSE ID 200 (Laser) ×1
WATER STERILE IRR 500ML POUR (IV SOLUTION) ×1 IMPLANT

## 2022-06-17 NOTE — Telephone Encounter (Signed)
Opened in error

## 2022-06-17 NOTE — Anesthesia Postprocedure Evaluation (Signed)
Anesthesia Post Note  Patient: Lejuan Botto Borrelli  Procedure(s) Performed: CYSTOSCOPY WITH RETROGRADE PYELOGRAM, URETEROSCOPY AND STENT PLACEMENT (Right: Ureter) HOLMIUM LASER APPLICATION (Right: Ureter)  Patient location during evaluation: Phase II Anesthesia Type: General Level of consciousness: awake and alert and oriented Pain management: pain level controlled Vital Signs Assessment: post-procedure vital signs reviewed and stable Respiratory status: spontaneous breathing, nonlabored ventilation and respiratory function stable Cardiovascular status: blood pressure returned to baseline and stable Postop Assessment: no apparent nausea or vomiting Anesthetic complications: no   No notable events documented.   Last Vitals:  Vitals:   06/17/22 1115 06/17/22 1126  BP: (!) 155/92   Pulse: 60   Resp: 16   Temp:    SpO2: 100% 98%    Last Pain:  Vitals:   06/17/22 1126  TempSrc:   PainSc: 1                  Yoland Scherr C Dama Hedgepeth

## 2022-06-17 NOTE — Anesthesia Preprocedure Evaluation (Signed)
Anesthesia Evaluation  Patient identified by MRN, date of birth, ID band Patient awake    Reviewed: Allergy & Precautions, NPO status , Patient's Chart, lab work & pertinent test results  Airway Mallampati: II  TM Distance: >3 FB Neck ROM: Full    Dental  (+) Dental Advisory Given, Missing, Caps   Pulmonary neg pulmonary ROS, former smoker,    Pulmonary exam normal breath sounds clear to auscultation       Cardiovascular Exercise Tolerance: Good hypertension, Pt. on medications + Past MI  Normal cardiovascular exam Rhythm:Regular Rate:Normal  1. Left ventricular ejection fraction, by estimation, is >75%. The left ventricle has hyperdynamic function. The left ventricle has no regional  wall motion abnormalities. Left ventricular diastolic parameters are consistent with Grade I diastolic  dysfunction (impaired relaxation).  2. Right ventricular systolic function is normal. The right ventricular size is normal.  3. The mitral valve is normal in structure. No evidence of mitral valve regurgitation. No evidence of mitral stenosis.  4. The aortic valve is tricuspid. Aortic valve regurgitation is not visualized. No aortic stenosis is present.  5. The inferior vena cava is normal in size with greater than 50% respiratory variability, suggesting right atrial pressure of 3 mmHg   Neuro/Psych negative neurological ROS  negative psych ROS   GI/Hepatic negative GI ROS, Neg liver ROS,   Endo/Other  diabetes, Well Controlled, Type 2, Oral Hypoglycemic Agents  Renal/GU negative Renal ROS  negative genitourinary   Musculoskeletal  (+) Arthritis  (gout),   Abdominal   Peds negative pediatric ROS (+)  Hematology negative hematology ROS (+)   Anesthesia Other Findings Referring MD: Curlene Labrum, MD   History of Present Illness:   Travis Paul is a 56 y.o. male with a hx of CAD, HTN, HLD who was previously followed  by Dr. Bronson Ing who now returns to clinic for follow-up.  Per review of the record, patient had nuclear stress test [facility] 01/04/2017 showed prior inferior wall infarct with no evidence of ischemia, there was inferior wall hypokinesis, LVEF 49%. Echo on 02/10/2017 demonstrated vigorous left ventricular systolic function, LVEF 70 to 75%, mild LVH, G1 DD.  Was seen by Levell July on 06/2020 with continued mild chest pain that was constant in nature. Myoview was obtained and was low risk with no ischemia.  Repeat TTE in 06/2020 with EF >75%, G1DD, normal RV, no significant valve disease.  Was last seen in clinic on 09/2021 where he continued to have atypical chest pain. Otherwise was doing well.   Today, the patient continues to have intermittent right sided chest pain. Feels like gas/fullness in the chest. Sometimes improves with belching. Symptoms not worsened by exertion.   Also notes he has been having dyspnea on exertion and was told he "had something in his lungs" on CT chest on 01/2022. Review of the CT scan showed a 10.69mm nodule and he is planned for repeat imaging today to reassess the nodule. We discussed that dyspnea could be related to heart artery disease (although I did not see any significant coronary calcium on his CT chest) but he would like to have his lungs worked up first. We can bring him back in 1 month to reassess.   Otherwise no LE edema, orthopnea, PND, palpitations, dizziness, syncope. Does not monitor blood pressure at home. Compliant with his medications.    Reproductive/Obstetrics negative OB ROS  Anesthesia Physical Anesthesia Plan  ASA: 3  Anesthesia Plan: General   Post-op Pain Management: Minimal or no pain anticipated   Induction: Intravenous  PONV Risk Score and Plan: 4 or greater and Ondansetron and Metaclopromide  Airway Management Planned: LMA  Additional Equipment:   Intra-op  Plan:   Post-operative Plan:   Informed Consent: I have reviewed the patients History and Physical, chart, labs and discussed the procedure including the risks, benefits and alternatives for the proposed anesthesia with the patient or authorized representative who has indicated his/her understanding and acceptance.     Dental advisory given  Plan Discussed with: CRNA and Surgeon  Anesthesia Plan Comments:         Anesthesia Quick Evaluation

## 2022-06-17 NOTE — Interval H&P Note (Signed)
History and Physical Interval Note:  06/17/2022 9:09 AM  Travis Paul  has presented today for surgery, with the diagnosis of right ureteral calculus.  The various methods of treatment have been discussed with the patient and family. After consideration of risks, benefits and other options for treatment, the patient has consented to  Procedure(s): CYSTOSCOPY WITH RETROGRADE PYELOGRAM, URETEROSCOPY AND STENT PLACEMENT (Right) HOLMIUM LASER APPLICATION (Right) as a surgical intervention.  The patient's history has been reviewed, patient examined, no change in status, stable for surgery.  I have reviewed the patient's chart and labs.  Questions were answered to the patient's satisfaction.     Michaelle Birks

## 2022-06-17 NOTE — Telephone Encounter (Signed)
Patient called to see if he was supposed to be taking any antibiotics after his surgery.  I informed him Dr. Felipa Eth had sent in a 5 days rx of Cipro and Pyridium for him to take prn for pain.  Patient voiced understanding.

## 2022-06-17 NOTE — Op Note (Signed)
OPERATIVE NOTE   Patient Name: Travis Paul  MRN: [id number]   Date of Procedure: 06/17/22  Preoperative diagnosis:  Right ureteral calculus  Postoperative diagnosis:  Right ureteral calculus  Procedure:  Cystoscopy Right retrograde pyelogram with intraoperative interpretation Right ureteroscopic laser lithotripsy with stone manipulation (obtained) Insertion of right ureteral stent (30F x 28 cm, with tether)  Attending: Milderd Meager, MD  Anesthesia: General  Estimated blood loss: Minimal  Fluids: Per anesthesia record  Drains: 30F x 28 cm ureteral stent on right , with tether  Specimens: Stone fragments  Antibiotics: Ancef 2 g IV  Findings: Lateral lobe enlargement of the prostate, normal bladder, obstructing calculus in the right distal ureter with associated ureteral edema, 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  He was initially diagnosed on CT imaging on 01/03/2022.  The study showed a 4 mm calculus in the right distal ureter with mild right ureterectasis.  He attempted spontaneous passage without success.  He was scheduled for right ESL on 04/28/2022 but the stone was unable to be visualized on preoperative KUB or with fluoroscopy.  He was symptom-free until last week when he developed right upper abdominal pain with associated nausea and vomiting.  CT imaging showed a 4 x 6 mm calculus in the right distal ureter resulting in moderate right hydronephrosis.  His creatinine was slightly elevated to 1.86.  He presents now for surgical management of the right ureteral calculus with cystoscopy, right retrograde pyelogram, right ureteroscopic laser lithotripsy, stone manipulation, and insertion of right ureteral stent.  Risks, benefits, alternatives were discussed with the patient in detail.  Potential risks including, but not limited to, infection; bleeding;  injury to  urethra, bladder, or ureter; possible need of other treatments; possible failure to remove the calculus; ureteral  stricture formation; cardiac, pulmonary, cerebrovascular events; and anesthetic complications were discussed.  The patient understands and wishes to proceed.   Description of Procedure:  The patient received IV Ancef preoperatively.  After successful induction of a general anesthetic, the patient was placed in the dorsolithotomy position.  The patient's genital area was prepped and draped in sterile fashion.  Under direct visualization, a 22 French rigid cystoscope was passed through the urethra and into the bladder.  The patient was noted to have lateral lobe enlargement of the prostate.  The bladder was inspected throughout its entirety.  No mucosal lesions were seen.  There was some edema of the right ureteral orifice noted.  No stones are seen within the bladder.  A right retrograde pyelogram was performed for evaluation of the patient's right upper tract given the diagnosis of a right ureteral calculus.  Scout film showed a faint calcification in the right pelvis consistent with the known ureteral calculus.  A nonspecific bowel gas pattern was seen with no obvious bony abnormalities.  Using a open-ended catheter, contrast was injected into the right ureter.  The previously noted calcification was confirmed to be within the right distal ureter.  There was some narrowing of the ureter distal to the calculus and dilation of the ureter proximally.  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  Dilation of the renal pelvis and calyces was also noted.  With the assistance of the open-ended catheter, a sensor guidewire was able to be negotiated beyond the stone and into the upper renal calyx.  Ureteroscopy was performed alongside the guidewire.  I was unable to visualize the  stone due to narrowing of the ureter just distal to the calculus.  Using a 12 French access sheath, the  distal ureter was gently dilated.  Following dilation the ureteroscope passed easily into the distal ureter.  Mucosal edema of the ureter was appreciated.  The stone was visualized.  Using the holmium laser fiber, the stone was fragmented into multiple small pieces.  A stone basket was used to remove all visible stone fragments.  The ureteroscope was passed into the mid ureter and a duplicated system was confirmed.  No other ureteral abnormalities were appreciated.  There was no evidence of any ureteral injury following the procedure.  A 6 French by 28 cm double-J stent was then passed over the guidewire and into the upper right renal calyx.  Position was confirmed with fluoroscopy.  The tether was left attached and brought through the meatus.  The bladder was drained and the cystoscope was removed.  Intraurethral lidocaine jelly was placed.  Complications: None  Condition: Stable, extubated, transferred to PACU  Plan:  Discharge to home Continue stent for 7 days.

## 2022-06-17 NOTE — Transfer of Care (Signed)
Immediate Anesthesia Transfer of Care Note  Patient: Romyn Boswell Ezekiel  Procedure(s) Performed: CYSTOSCOPY WITH RETROGRADE PYELOGRAM, URETEROSCOPY AND STENT PLACEMENT (Right: Ureter) HOLMIUM LASER APPLICATION (Right: Ureter)  Patient Location: PACU  Anesthesia Type:General  Level of Consciousness: awake, alert , oriented and patient cooperative  Airway & Oxygen Therapy: Patient Spontanous Breathing and Patient connected to face mask oxygen  Post-op Assessment: Report given to RN, Post -op Vital signs reviewed and stable and Patient moving all extremities X 4  Post vital signs: Reviewed and stable  Last Vitals:  Vitals Value Taken Time  BP 160/109 06/17/22 1100  Temp    Pulse 57 06/17/22 1104  Resp 12 06/17/22 1104  SpO2 99 % 06/17/22 1104  Vitals shown include unvalidated device data.  Last Pain:  Vitals:   06/17/22 0811  TempSrc: Oral  PainSc: 0-No pain         Complications: No notable events documented.

## 2022-06-17 NOTE — Anesthesia Procedure Notes (Signed)
Procedure Name: LMA Insertion Date/Time: 06/17/2022 9:51 AM  Performed by: Jonna Munro, CRNAPre-anesthesia Checklist: Patient identified, Emergency Drugs available, Suction available, Patient being monitored and Timeout performed Patient Re-evaluated:Patient Re-evaluated prior to induction Oxygen Delivery Method: Circle system utilized Preoxygenation: Pre-oxygenation with 100% oxygen Induction Type: IV induction LMA: LMA inserted LMA Size: 5.0 Number of attempts: 1 Placement Confirmation: positive ETCO2, CO2 detector and breath sounds checked- equal and bilateral Tube secured with: Tape Dental Injury: Teeth and Oropharynx as per pre-operative assessment

## 2022-06-18 ENCOUNTER — Telehealth: Payer: Self-pay | Admitting: Cardiology

## 2022-06-18 ENCOUNTER — Encounter: Payer: Self-pay | Admitting: Urology

## 2022-06-18 ENCOUNTER — Ambulatory Visit (INDEPENDENT_AMBULATORY_CARE_PROVIDER_SITE_OTHER): Payer: 59 | Admitting: Urology

## 2022-06-18 VITALS — BP 119/72 | HR 88 | Ht 72.0 in | Wt 240.0 lb

## 2022-06-18 DIAGNOSIS — R39198 Other difficulties with micturition: Secondary | ICD-10-CM | POA: Diagnosis not present

## 2022-06-18 DIAGNOSIS — R350 Frequency of micturition: Secondary | ICD-10-CM | POA: Diagnosis not present

## 2022-06-18 DIAGNOSIS — Z87442 Personal history of urinary calculi: Secondary | ICD-10-CM | POA: Diagnosis not present

## 2022-06-18 DIAGNOSIS — N201 Calculus of ureter: Secondary | ICD-10-CM

## 2022-06-18 LAB — BLADDER SCAN AMB NON-IMAGING: Scan Result: 0

## 2022-06-18 NOTE — Progress Notes (Signed)
post void residual =0mL 

## 2022-06-18 NOTE — Progress Notes (Signed)
Assessment: 1. Frequency of urination   2. Difficulty voiding   3. Ureteral calculus, right     Plan:  Patient reassured that he is emptying his bladder. His symptoms are consistent with bladder irritation from the right ureteral stent. Recommend continuing alfuzosin, Cipro, and Pyridium as needed. Samples of Myrbetriq 25 mg daily provided. Keep follow-up for 06/24/2022 as scheduled for stent removal.  Chief Complaint:  Chief Complaint  Patient presents with   Urinary Frequency    History of Present Illness:  Travis Paul is a 56 y.o. year old male who is seen for further evaluation of a right ureteral calculus. At his initial visit in February 2023, he reported a recent episode of inability to void for approximately 8 hours.  When he was able to void, he noted specks of blood in the urine.  Urinalysis from 10/16/2021 showed 2+ blood and positive leukocytes.  A urine culture grew E. coli.  He was initially treated with Bactrim and was changed to Gallitzin.  He reported a strong urine odor.  He reported baseline symptoms of urinary frequency, urgency, and nocturia up to 5-6 times per night, intermittent stream and some straining to void.  He has had UTIs at least once per year. No history of kidney stones.  He has a history of microscopic hematuria.  He has previously been evaluated with CT imaging and cystoscopy which were negative by report.  He also has a history of prostatitis.   He was started on alfuzosin at his visit in 2/23. PVR = 8 mL. He continued on alfuzosin with some improvement in his lower urinary tract symptoms.  He continued to have nocturia x 6 at times and occasional dysuria.  No further gross hematuria. IPSS = 14. Urine culture from 3/23 showed no growth.  CT hematuria protocol from 01/03/2022 showed a 4 mm calculus in the lower pole of the left kidney and mild right ureterectasis with a 4 mm calculus in the distal right ureter.  At his visit on  01/06/2022, he was not having any dysuria or gross hematuria.  No flank pain.  No fevers or chills.  He continued to have frequency, urgency, and nocturia. Urine culture grew >100 K E. coli.  He was treated with Macrobid x10 days.  He was also started on daily Macrobid for UTI prevention.  He was continued on alfuzosin. 24-hour voiding diary indicated significant nocturnal polyuria with nighttime outputs of 68%, 88%, and 74% of total volume.  At his visit on 01/29/22, he continued on alfuzosin and daily Macrobid.  He continued to report frequency, nocturia, and incomplete emptying.  No gross hematuria.  He also reported occasional dysuria.  No flank pain.  He was not aware of passing a stone. IPSS = 11.  PVR = 40 ml. Cystoscopy was postponed due to an abnormal urinalysis and possible UTI.  He was treated with Levaquin x7 days.  Urine culture grew <10K colonies. Cystoscopy from 6/23 demonstrated lateral lobe enlargement of the prostate, no bladder abnormalities.  Urine cytology was negative for malignancy. He continued on alfuzosin.  He reported sensation of incomplete emptying, frequency, intermittent stream, and nocturia x 2.  His LUTS were slightly improved.  He was not aware of passing a stone.   IPSS = 18.  KUB from 02/12/2022 showed a probable 5 mm calculus in the area of the right distal ureter and a 3 mm left lower pole calculus. KUB from 03/25/2022 showed a 4 x 2 mm calculus in  the right pelvis consistent with the distal ureteral calculus. He was scheduled for right ESL on 04/28/2022 but the stone was not visualized on preoperative KUB or with fluoroscopy.  PSA 7/23:  0.5  He was seen in the emergency room yesterday for evaluation of right upper quadrant abdominal pain with associated nausea and vomiting.  No fever or chills.  CT imaging showed a 4 x 6 mm calculus in the right distal ureter near the UVJ resulting in moderate right hydronephrosis.  His creatinine was slightly elevated at 1.86.   Urinalysis showed 0-5 RBCs, no bacteria.  He underwent cystoscopy, right retrograde pyelogram, right ureteroscopic laser lithotripsy with insertion of right ureteral stent on 06/17/2022.  His stone was removed. He presents today for evaluation of urinary symptoms.  He is having dysuria and right lower quadrant discomfort with voiding.  He is voiding frequently and does not feel like he is emptying his bladder completely.  He is on alfuzosin, Cipro, and Pyridium.  No fevers or chills.  Portions of the above documentation were copied from a prior visit for review purposes only.    Past Medical History:  Past Medical History:  Diagnosis Date   Achilles tendinitis, right leg    Chronic systolic heart failure (HCC)    Diabetes mellitus without complication (HCC)    Hyperlipidemia    Hypertension    Idiopathic gout, unspecified site    Mallet finger of left hand    Old myocardial infarction     Past Surgical History:  Past Surgical History:  Procedure Laterality Date   COLONOSCOPY N/A 07/21/2019   Procedure: COLONOSCOPY;  Surgeon: Corbin Ade, MD;  Location: [facility];  Service: Endoscopy;  Laterality: N/A;  9:30   LUNG SURGERY  07/05/2019   from a lung injury   NO PAST SURGERIES     POLYPECTOMY  07/21/2019   Procedure: POLYPECTOMY;  Surgeon: Corbin Ade, MD;  Location: [facility];  Service: Endoscopy;;  colon    Allergies:  Allergies  Allergen Reactions   Morphine And Related Itching    Family History:  Family History  Problem Relation Age of Onset   Liver cancer Mother    Colon polyps Paternal Uncle     Social History:  Social History   Tobacco Use   Smoking status: Former    Packs/day: 1.00    Types: Cigarettes    Quit date: 2012    Years since quitting: 11.7   Smokeless tobacco: Never  Vaping Use   Vaping Use: Never used  Substance Use Topics   Alcohol use: Yes    Comment: 1 beer on weekends   Drug use: Never    ROS: Constitutional:   Negative for fever, chills, weight loss CV: Negative for chest pain, previous MI, hypertension Respiratory:  Negative for shortness of breath, wheezing, sleep apnea, frequent cough GI:  Negative for nausea, vomiting, bloody stool, GERD  Physical exam: BP 119/72   Pulse 88   Ht 6' (1.829 m)   Wt 240 lb (108.9 kg)   BMI 32.55 kg/m  GENERAL APPEARANCE:  Well appearing, well developed, well nourished, NAD HEENT:  Atraumatic, normocephalic, oropharynx clear NECK:  Supple without lymphadenopathy or thyromegaly ABDOMEN:  Soft, non-tender, no masses EXTREMITIES:  Moves all extremities well, without clubbing, cyanosis, or edema NEUROLOGIC:  Alert and oriented x 3, normal gait, CN II-XII grossly intact MENTAL STATUS:  appropriate BACK:  Non-tender to palpation, No CVAT SKIN:  Warm, dry, and intact  Results:  None  PVR = 0 ml

## 2022-06-18 NOTE — Telephone Encounter (Signed)
History in chart states Old Myocardial Infarction.

## 2022-06-18 NOTE — Telephone Encounter (Signed)
Patient is concerned his record shows he had a heart attack but he stated he was not aware of this diagnosis.  Patient stated he had kidney stones removed yesterday and they brought this to his attention.

## 2022-06-19 NOTE — Telephone Encounter (Signed)
Patient notified and verbalized understanding. 

## 2022-06-23 ENCOUNTER — Encounter (HOSPITAL_COMMUNITY): Payer: Self-pay | Admitting: Urology

## 2022-06-24 ENCOUNTER — Encounter: Payer: Self-pay | Admitting: Urology

## 2022-06-24 ENCOUNTER — Ambulatory Visit (INDEPENDENT_AMBULATORY_CARE_PROVIDER_SITE_OTHER): Payer: 59 | Admitting: Urology

## 2022-06-24 VITALS — BP 135/81 | HR 101 | Ht 72.0 in | Wt 240.0 lb

## 2022-06-24 DIAGNOSIS — N401 Enlarged prostate with lower urinary tract symptoms: Secondary | ICD-10-CM

## 2022-06-24 DIAGNOSIS — R310 Gross hematuria: Secondary | ICD-10-CM

## 2022-06-24 DIAGNOSIS — Z87442 Personal history of urinary calculi: Secondary | ICD-10-CM

## 2022-06-24 DIAGNOSIS — N138 Other obstructive and reflux uropathy: Secondary | ICD-10-CM | POA: Diagnosis not present

## 2022-06-24 DIAGNOSIS — N201 Calculus of ureter: Secondary | ICD-10-CM

## 2022-06-24 MED ORDER — CIPROFLOXACIN HCL 500 MG PO TABS: 500.0000 mg | ORAL_TABLET | Freq: Once | ORAL | Status: DC

## 2022-06-24 MED ORDER — CIPROFLOXACIN HCL 500 MG PO TABS
500.0000 mg | ORAL_TABLET | Freq: Once | ORAL | Status: AC
  Administered 2022-06-24: 500 mg via ORAL

## 2022-06-24 NOTE — Progress Notes (Signed)
Assessment: 1. Ureteral calculus, right   2. BPH with obstruction/lower urinary tract symptoms    Plan:  Right ureteral stent removed today via tether Cipro x 1 following stent removal Continue alfuzosin Return to office in 1 month.  Chief Complaint:  Chief Complaint  Patient presents with   ureteral calculus   History of Present Illness:  Travis Paul is a 56 y.o. year old male who is seen for further evaluation of a right ureteral calculus. At his initial visit in February 2023, he reported a recent episode of inability to void for approximately 8 hours.  When he was able to void, he noted specks of blood in the urine.  Urinalysis from 10/16/2021 showed 2+ blood and positive leukocytes.  A urine culture grew E. coli.  He was initially treated with Bactrim and was changed to Macrobid.  He reported a strong urine odor.  He reported baseline symptoms of urinary frequency, urgency, and nocturia up to 5-6 times per night, intermittent stream and some straining to void.  He has had UTIs at least once per year. No history of kidney stones.  He has a history of microscopic hematuria.  He has previously been evaluated with CT imaging and cystoscopy which were negative by report.  He also has a history of prostatitis.   He was started on alfuzosin at his visit in 2/23. PVR = 8 mL. He continued on alfuzosin with some improvement in his lower urinary tract symptoms.  He continued to have nocturia x 6 at times and occasional dysuria.  No further gross hematuria. IPSS = 14. Urine culture from 3/23 showed no growth.  CT hematuria protocol from 01/03/2022 showed a 4 mm calculus in the lower pole of the left kidney and mild right ureterectasis with a 4 mm calculus in the distal right ureter.  At his visit on 01/06/2022, he was not having any dysuria or gross hematuria.  No flank pain.  No fevers or chills.  He continued to have frequency, urgency, and nocturia. Urine culture grew >100 K E.  coli.  He was treated with Macrobid x10 days.  He was also started on daily Macrobid for UTI prevention.  He was continued on alfuzosin. 24-hour voiding diary indicated significant nocturnal polyuria with nighttime outputs of 68%, 88%, and 74% of total volume.  At his visit on 01/29/22, he continued on alfuzosin and daily Macrobid.  He continued to report frequency, nocturia, and incomplete emptying.  No gross hematuria.  He also reported occasional dysuria.  No flank pain.  He was not aware of passing a stone. IPSS = 11.  PVR = 40 ml. Cystoscopy was postponed due to an abnormal urinalysis and possible UTI.  He was treated with Levaquin x7 days.  Urine culture grew <10K colonies. Cystoscopy from 6/23 demonstrated lateral lobe enlargement of the prostate, no bladder abnormalities.  Urine cytology was negative for malignancy. He continued on alfuzosin.  He reported sensation of incomplete emptying, frequency, intermittent stream, and nocturia x 2.  His LUTS were slightly improved.  He was not aware of passing a stone.   IPSS = 18.  KUB from 02/12/2022 showed a probable 5 mm calculus in the area of the right distal ureter and a 3 mm left lower pole calculus. KUB from 03/25/2022 showed a 4 x 2 mm calculus in the right pelvis consistent with the distal ureteral calculus. He was scheduled for right ESL on 04/28/2022 but the stone was not visualized on preoperative KUB or  with fluoroscopy.  PSA 7/23:  0.5  He was seen in the emergency room on 06/09/22 for evaluation of right upper quadrant abdominal pain with associated nausea and vomiting.  No fever or chills.  CT imaging showed a 4 x 6 mm calculus in the right distal ureter near the UVJ resulting in moderate right hydronephrosis.  His creatinine was slightly elevated at 1.86.  Urinalysis showed 0-5 RBCs, no bacteria.  He underwent cystoscopy, right retrograde pyelogram, right ureteroscopic laser lithotripsy with insertion of right ureteral stent on 06/17/2022.   His stone was removed.  He presents today for stent removal.  He is having some stent related symptoms with frequency, urgency, and dysuria.  He has also having some intermittent hematuria.  He completed his Cipro yesterday.  No fevers or chills.  No flank pain.   Portions of the above documentation were copied from a prior visit for review purposes only.    Past Medical History:  Past Medical History:  Diagnosis Date   Achilles tendinitis, right leg    Chronic systolic heart failure (HCC)    Diabetes mellitus without complication (HCC)    Hyperlipidemia    Hypertension    Idiopathic gout, unspecified site    Mallet finger of left hand    Old myocardial infarction     Past Surgical History:  Past Surgical History:  Procedure Laterality Date   COLONOSCOPY N/A 07/21/2019   Procedure: COLONOSCOPY;  Surgeon: Daneil Dolin, MD;  [facility];  Service: Endoscopy;  Laterality: N/A;  9:30   CYSTOSCOPY WITH RETROGRADE PYELOGRAM, URETEROSCOPY AND STENT PLACEMENT Right 06/17/2022   Procedure: CYSTOSCOPY WITH RETROGRADE PYELOGRAM, URETEROSCOPY AND STENT PLACEMENT;  Surgeon: Primus Bravo., MD;  Location: [facility];  Service: Urology;  Laterality: Right;   HOLMIUM LASER APPLICATION Right 69/12/8544   Procedure: HOLMIUM LASER APPLICATION;  Surgeon: Primus Bravo., MD;  Location: [facility];  Service: Urology;  Laterality: Right;   LUNG SURGERY  07/05/2019   from a lung injury   NO PAST SURGERIES     POLYPECTOMY  07/21/2019   Procedure: POLYPECTOMY;  Surgeon: Daneil Dolin, MD;  [facility];  Service: Endoscopy;;  colon    Allergies:  Allergies  Allergen Reactions   Morphine And Related Itching    Family History:  Family History  Problem Relation Age of Onset   Liver cancer Mother    Colon polyps Paternal Uncle     Social History:  Social History   Tobacco Use   Smoking status: Former    Packs/day: 1.00    Types: Cigarettes    Quit date:  2012    Years since quitting: 11.7   Smokeless tobacco: Never  Vaping Use   Vaping Use: Never used  Substance Use Topics   Alcohol use: Yes    Comment: 1 beer on weekends   Drug use: Never    ROS: Constitutional:  Negative for fever, chills, weight loss CV: Negative for chest pain, previous MI, hypertension Respiratory:  Negative for shortness of breath, wheezing, sleep apnea, frequent cough GI:  Negative for nausea, vomiting, bloody stool, GERD  Physical exam: BP 135/81   Pulse (!) 101   Ht 6' (1.829 m)   Wt 240 lb (108.9 kg)   BMI 32.55 kg/m  GENERAL APPEARANCE:  Well appearing, well developed, well nourished, NAD HEENT:  Atraumatic, normocephalic, oropharynx clear NECK:  Supple without lymphadenopathy or thyromegaly ABDOMEN:  Soft, non-tender, no masses EXTREMITIES:  Moves all extremities  well, without clubbing, cyanosis, or edema NEUROLOGIC:  Alert and oriented x 3, normal gait, CN II-XII grossly intact MENTAL STATUS:  appropriate BACK:  Non-tender to palpation, No CVAT SKIN:  Warm, dry, and intact  Results: U/A: 6/10 WBCs, >30 RBCs, few bacteria, nitrite positive

## 2022-06-25 LAB — CALCULI, WITH PHOTOGRAPH (CLINICAL LAB)
Calcium Oxalate Dihydrate: 20 %
Calcium Oxalate Monohydrate: 75 %
Hydroxyapatite: 5 %
Weight Calculi: 27 mg

## 2022-06-25 LAB — URINALYSIS, ROUTINE W REFLEX MICROSCOPIC
Bilirubin, UA: NEGATIVE
Nitrite, UA: POSITIVE — AB
Specific Gravity, UA: 1.02 (ref 1.005–1.030)
Urobilinogen, Ur: 1 mg/dL (ref 0.2–1.0)
pH, UA: 5.5 (ref 5.0–7.5)

## 2022-06-25 LAB — MICROSCOPIC EXAMINATION: RBC, Urine: >30 /hpf — AB (ref 0–2)

## 2022-07-21 ENCOUNTER — Encounter: Payer: Self-pay | Admitting: Urology

## 2022-07-21 ENCOUNTER — Ambulatory Visit (INDEPENDENT_AMBULATORY_CARE_PROVIDER_SITE_OTHER): Payer: 59 | Admitting: Urology

## 2022-07-21 VITALS — BP 119/69 | HR 102

## 2022-07-21 DIAGNOSIS — Z6831 Body mass index (BMI) 31.0-31.9, adult: Secondary | ICD-10-CM | POA: Diagnosis not present

## 2022-07-21 DIAGNOSIS — Z87891 Personal history of nicotine dependence: Secondary | ICD-10-CM | POA: Diagnosis not present

## 2022-07-21 DIAGNOSIS — I252 Old myocardial infarction: Secondary | ICD-10-CM | POA: Diagnosis not present

## 2022-07-21 DIAGNOSIS — N529 Male erectile dysfunction, unspecified: Secondary | ICD-10-CM | POA: Diagnosis not present

## 2022-07-21 DIAGNOSIS — N401 Enlarged prostate with lower urinary tract symptoms: Secondary | ICD-10-CM | POA: Diagnosis not present

## 2022-07-21 DIAGNOSIS — Z87442 Personal history of urinary calculi: Secondary | ICD-10-CM

## 2022-07-21 DIAGNOSIS — Z87892 Personal history of anaphylaxis: Secondary | ICD-10-CM | POA: Diagnosis not present

## 2022-07-21 DIAGNOSIS — Z888 Allergy status to other drugs, medicaments and biological substances status: Secondary | ICD-10-CM | POA: Diagnosis not present

## 2022-07-21 DIAGNOSIS — N201 Calculus of ureter: Secondary | ICD-10-CM

## 2022-07-21 DIAGNOSIS — R3129 Other microscopic hematuria: Secondary | ICD-10-CM

## 2022-07-21 DIAGNOSIS — N138 Other obstructive and reflux uropathy: Secondary | ICD-10-CM

## 2022-07-21 DIAGNOSIS — N200 Calculus of kidney: Secondary | ICD-10-CM

## 2022-07-21 DIAGNOSIS — E669 Obesity, unspecified: Secondary | ICD-10-CM | POA: Diagnosis not present

## 2022-07-21 NOTE — Progress Notes (Signed)
Assessment: 1. Ureteral calculus, right   2. BPH with obstruction/lower urinary tract symptoms   3. Nephrolithiasis   4. Microscopic hematuria   5. Organic impotence     Plan:  Continue alfuzosin Continue stone prevention Schedule for renal ultrasound at [facility] for evaluation of upper tracts following surgical management of ureteral calculus. Options for meant of erectile dysfunction discussed with the patient.  Unfortunately given his nitrate use, he is not a candidate for medical therapy with PDE5 inhibitors.  I discussed other options including vacuum erection device, intracavernosal injections, and intraurethral suppository. Return to office in 6 months  Chief Complaint:  Chief Complaint  Patient presents with   ureteral calculus   History of Present Illness:  Travis Paul is a 56 y.o. year old male who is seen for further evaluation of a right ureteral calculus, microscopic hematuria, and BPH with obstruction. At his initial visit in February 2023, he reported a recent episode of inability to void for approximately 8 hours.  When he was able to void, he noted specks of blood in the urine.  Urinalysis from 10/16/2021 showed 2+ blood and positive leukocytes.  A urine culture grew E. coli.  He was initially treated with Bactrim and was changed to Macrobid.  He reported a strong urine odor.  He reported baseline symptoms of urinary frequency, urgency, and nocturia up to 5-6 times per night, intermittent stream and some straining to void.  He has had UTIs at least once per year. No history of kidney stones.  He has a history of microscopic hematuria.  He has previously been evaluated with CT imaging and cystoscopy which were negative by report.  He also has a history of prostatitis.   He was started on alfuzosin at his visit in 2/23. PVR = 8 mL. He continued on alfuzosin with some improvement in his lower urinary tract symptoms.  He continued to have nocturia x 6 at times  and occasional dysuria.  No further gross hematuria. IPSS = 14. Urine culture from 3/23 showed no growth.  CT hematuria protocol from 01/03/2022 showed a 4 mm calculus in the lower pole of the left kidney and mild right ureterectasis with a 4 mm calculus in the distal right ureter.  At his visit on 01/06/2022, he was not having any dysuria or gross hematuria.  No flank pain.  No fevers or chills.  He continued to have frequency, urgency, and nocturia. Urine culture grew >100 K E. coli.  He was treated with Macrobid x10 days.  He was also started on daily Macrobid for UTI prevention.  He was continued on alfuzosin. 24-hour voiding diary indicated significant nocturnal polyuria with nighttime outputs of 68%, 88%, and 74% of total volume.  At his visit on 01/29/22, he continued on alfuzosin and daily Macrobid.  He continued to report frequency, nocturia, and incomplete emptying.  No gross hematuria.  He also reported occasional dysuria.  No flank pain.  He was not aware of passing a stone. IPSS = 11.  PVR = 40 ml. Cystoscopy was postponed due to an abnormal urinalysis and possible UTI.  He was treated with Levaquin x7 days.  Urine culture grew <10K colonies. Cystoscopy from 6/23 demonstrated lateral lobe enlargement of the prostate, no bladder abnormalities.  Urine cytology was negative for malignancy. He continued on alfuzosin.  He reported sensation of incomplete emptying, frequency, intermittent stream, and nocturia x 2.  His LUTS were slightly improved.  He was not aware of passing a  stone.   IPSS = 18.  KUB from 02/12/2022 showed a probable 5 mm calculus in the area of the right distal ureter and a 3 mm left lower pole calculus. KUB from 03/25/2022 showed a 4 x 2 mm calculus in the right pelvis consistent with the distal ureteral calculus. He was scheduled for right ESL on 04/28/2022 but the stone was not visualized on preoperative KUB or with fluoroscopy.  PSA 7/23:  0.5  He was seen in the  emergency room on 06/09/22 for evaluation of right upper quadrant abdominal pain with associated nausea and vomiting.  No fever or chills.  CT imaging showed a 4 x 6 mm calculus in the right distal ureter near the UVJ resulting in moderate right hydronephrosis.  His creatinine was slightly elevated at 1.86.  Urinalysis showed 0-5 RBCs, no bacteria.  He underwent cystoscopy, right retrograde pyelogram, right ureteroscopic laser lithotripsy with insertion of right ureteral stent on 06/17/2022.  His stone was removed at the time of the procedure. His right ureteral stent was removed on 06/24/2022. Stone analysis: 95% calcium oxalate, 5% hydroxyapatite  He returns today for follow-up.  He continues with LUTS, primarily nocturia.  No dysuria or gross hematuria.  He remains on alfuzosin. No flank pain. IPSS = 13 today. He reports difficulty achieving and maintaining his erections.  No pain or curvature.  No decreased libido.  He has not tried any therapy to date.  He does take nitrates (isosorbide mononitrate, nitroglycerin sublingual).  Portions of the above documentation were copied from a prior visit for review purposes only.    Past Medical History:  Past Medical History:  Diagnosis Date   Achilles tendinitis, right leg    Chronic systolic heart failure (HCC)    Diabetes mellitus without complication (HCC)    Hyperlipidemia    Hypertension    Idiopathic gout, unspecified site    Mallet finger of left hand    Old myocardial infarction     Past Surgical History:  Past Surgical History:  Procedure Laterality Date   COLONOSCOPY N/A 07/21/2019   Procedure: COLONOSCOPY;  Surgeon: Corbin Ade, MD;  [facility];  Service: Endoscopy;  Laterality: N/A;  9:30   CYSTOSCOPY WITH RETROGRADE PYELOGRAM, URETEROSCOPY AND STENT PLACEMENT Right 06/17/2022   Procedure: CYSTOSCOPY WITH RETROGRADE PYELOGRAM, URETEROSCOPY AND STENT PLACEMENT;  Surgeon: Milderd Meager., MD;  Location: [facility];  Service: Urology;  Laterality: Right;   HOLMIUM LASER APPLICATION Right 06/17/2022   Procedure: HOLMIUM LASER APPLICATION;  Surgeon: Milderd Meager., MD;  Location: [facility];  Service: Urology;  Laterality: Right;   LUNG SURGERY  07/05/2019   from a lung injury   NO PAST SURGERIES     POLYPECTOMY  07/21/2019   Procedure: POLYPECTOMY;  Surgeon: Corbin Ade, MD;  [facility];  Service: Endoscopy;;  colon    Allergies:  Allergies  Allergen Reactions   Morphine And Related Itching    Family History:  Family History  Problem Relation Age of Onset   Liver cancer Mother    Colon polyps Paternal Uncle     Social History:  Social History   Tobacco Use   Smoking status: Former    Packs/day: 1.00    Types: Cigarettes    Quit date: 2012    Years since quitting: 11.8   Smokeless tobacco: Never  Vaping Use   Vaping Use: Never used  Substance Use Topics   Alcohol use: Yes    Comment:  1 beer on weekends   Drug use: Never    ROS: Constitutional:  Negative for fever, chills, weight loss CV: Negative for chest pain, previous MI, hypertension Respiratory:  Negative for shortness of breath, wheezing, sleep apnea, frequent cough GI:  Negative for nausea, vomiting, bloody stool, GERD  Physical exam: BP 119/69   Pulse (!) 102  GENERAL APPEARANCE:  Well appearing, well developed, well nourished, NAD HEENT:  Atraumatic, normocephalic, oropharynx clear NECK:  Supple without lymphadenopathy or thyromegaly ABDOMEN:  Soft, non-tender, no masses EXTREMITIES:  Moves all extremities well, without clubbing, cyanosis, or edema NEUROLOGIC:  Alert and oriented x 3, normal gait, CN II-XII grossly intact MENTAL STATUS:  appropriate BACK:  Non-tender to palpation, No CVAT SKIN:  Warm, dry, and intact  Results: U/A: 0-5 WBCs, 3-10 RBCs

## 2022-07-22 ENCOUNTER — Ambulatory Visit (HOSPITAL_COMMUNITY)
Admission: RE | Admit: 2022-07-22 | Discharge: 2022-07-22 | Disposition: A | Discharge status: Home / Self Care | Payer: 59 | Source: Ambulatory Visit | Attending: Urology | Admitting: Urology

## 2022-07-22 DIAGNOSIS — N201 Calculus of ureter: Secondary | ICD-10-CM | POA: Diagnosis not present

## 2022-07-22 LAB — URINALYSIS, ROUTINE W REFLEX MICROSCOPIC
Bilirubin, UA: NEGATIVE
Glucose, UA: NEGATIVE
Leukocytes,UA: NEGATIVE
Nitrite, UA: NEGATIVE
Specific Gravity, UA: 1.03 (ref 1.005–1.030)
Urobilinogen, Ur: 1 mg/dL (ref 0.2–1.0)
pH, UA: 5.5 (ref 5.0–7.5)

## 2022-07-22 LAB — MICROSCOPIC EXAMINATION: Bacteria, UA: NONE SEEN

## 2022-07-28 ENCOUNTER — Ambulatory Visit: Payer: 59 | Admitting: Urology

## 2022-07-30 ENCOUNTER — Telehealth: Payer: Self-pay

## 2022-07-30 NOTE — Telephone Encounter (Signed)
-----   Message from Milderd Meager, MD sent at 07/27/2022  1:02 PM EST ----- Please notify patient that his renal U/S looks good. Follow-up in 6 months as scheduled.

## 2022-07-30 NOTE — Telephone Encounter (Signed)
Unable to reach pt by phone, letter sent informing pt.

## 2022-09-01 ENCOUNTER — Telehealth: Payer: Self-pay

## 2022-09-01 NOTE — Telephone Encounter (Signed)
Patient has Medication non-adherence notification from [facility]. Called to verify he is still taking amlodipine 10 mg. Left message for a return call.

## 2022-10-05 DIAGNOSIS — N401 Enlarged prostate with lower urinary tract symptoms: Secondary | ICD-10-CM | POA: Diagnosis not present

## 2022-10-05 DIAGNOSIS — I10 Essential (primary) hypertension: Secondary | ICD-10-CM | POA: Diagnosis not present

## 2022-10-05 DIAGNOSIS — R911 Solitary pulmonary nodule: Secondary | ICD-10-CM | POA: Diagnosis not present

## 2022-10-05 DIAGNOSIS — E782 Mixed hyperlipidemia: Secondary | ICD-10-CM | POA: Diagnosis not present

## 2022-10-05 DIAGNOSIS — I25119 Atherosclerotic heart disease of native coronary artery with unspecified angina pectoris: Secondary | ICD-10-CM | POA: Diagnosis not present

## 2022-10-05 DIAGNOSIS — N200 Calculus of kidney: Secondary | ICD-10-CM | POA: Diagnosis not present

## 2022-10-05 DIAGNOSIS — E1165 Type 2 diabetes mellitus with hyperglycemia: Secondary | ICD-10-CM | POA: Diagnosis not present

## 2022-10-05 DIAGNOSIS — Z87891 Personal history of nicotine dependence: Secondary | ICD-10-CM | POA: Diagnosis not present

## 2022-10-05 DIAGNOSIS — Z6832 Body mass index (BMI) 32.0-32.9, adult: Secondary | ICD-10-CM | POA: Diagnosis not present

## 2022-10-05 DIAGNOSIS — I5022 Chronic systolic (congestive) heart failure: Secondary | ICD-10-CM | POA: Diagnosis not present

## 2022-10-05 DIAGNOSIS — N138 Other obstructive and reflux uropathy: Secondary | ICD-10-CM | POA: Diagnosis not present

## 2022-10-05 DIAGNOSIS — I700 Atherosclerosis of aorta: Secondary | ICD-10-CM | POA: Diagnosis not present

## 2022-10-07 ENCOUNTER — Other Ambulatory Visit: Payer: Self-pay | Admitting: *Deleted

## 2022-10-07 MED ORDER — ISOSORBIDE MONONITRATE ER 30 MG PO TB24: 30.0000 mg | ORAL_TABLET | Freq: Every day | ORAL | 0 refills | Status: DC

## 2022-10-08 ENCOUNTER — Ambulatory Visit: Payer: 59 | Attending: Internal Medicine | Admitting: Internal Medicine

## 2022-10-08 ENCOUNTER — Encounter: Payer: Self-pay | Admitting: Internal Medicine

## 2022-10-08 VITALS — BP 112/68 | HR 92 | Ht 72.0 in | Wt 242.6 lb

## 2022-10-08 DIAGNOSIS — E785 Hyperlipidemia, unspecified: Secondary | ICD-10-CM | POA: Insufficient documentation

## 2022-10-08 DIAGNOSIS — R079 Chest pain, unspecified: Secondary | ICD-10-CM | POA: Insufficient documentation

## 2022-10-08 DIAGNOSIS — E7849 Other hyperlipidemia: Secondary | ICD-10-CM | POA: Diagnosis not present

## 2022-10-08 DIAGNOSIS — Z6831 Body mass index (BMI) 31.0-31.9, adult: Secondary | ICD-10-CM | POA: Diagnosis not present

## 2022-10-08 DIAGNOSIS — I10 Essential (primary) hypertension: Secondary | ICD-10-CM

## 2022-10-08 DIAGNOSIS — E1165 Type 2 diabetes mellitus with hyperglycemia: Secondary | ICD-10-CM | POA: Diagnosis not present

## 2022-10-08 DIAGNOSIS — N401 Enlarged prostate with lower urinary tract symptoms: Secondary | ICD-10-CM | POA: Diagnosis not present

## 2022-10-08 MED ORDER — METOPROLOL TARTRATE 100 MG PO TABS: 100.0000 mg | ORAL_TABLET | Freq: Once | ORAL | 0 refills | Status: DC

## 2022-10-08 NOTE — Patient Instructions (Addendum)
Medication Instructions:  Your physician recommends that you continue on your current medications as directed. Please refer to the Current Medication list given to you today.  Labwork: none  Testing/Procedures: Coronary CTA-see instructions below  Follow-Up: Your physician recommends that you schedule a follow-up appointment in: 6 months  Any Other Special Instructions Will Be Listed Below (If Applicable).  If you need a refill on your cardiac medications before your next appointment, please call your pharmacy.    Your cardiac CT will be scheduled at one of the below locations:   [facility] [address] [place], [place] [address] [phone]   If scheduled at [facility], please arrive at the [facility] and Children's Entrance (Entrance C2) of [facility] 30 minutes prior to test start time. You can use the FREE valet parking offered at entrance C (encouraged to control the heart rate for the test)  Proceed to the [facility] Radiology Department (first floor) to check-in and test prep.  All radiology patients and guests should use entrance C2 at [facility], accessed from [facility], even though the hospital's physical address listed is [address].    Please follow these instructions carefully (unless otherwise directed):  Hold all erectile dysfunction medications at least 3 days (72 hrs) prior to test. (Ie viagra, cialis, sildenafil, tadalafil, etc) We will administer nitroglycerin during this exam.   On the Night Before the Test: Be sure to Drink plenty of water. Do not consume any caffeinated/decaffeinated beverages or chocolate 12 hours prior to your test. Do not take any antihistamines 12 hours prior to your test. If the patient has contrast allergy: No contrast allergy  On the Day of the Test: Drink plenty of water until 1 hour prior to the test. Do not eat any food 1 hour prior to test. You may take  your regular medications prior to the test except metformin Take metoprolol 100 mg (Lopressor) two hours prior to test.      After the Test: Drink plenty of water. After receiving IV contrast, you may experience a mild flushed feeling. This is normal. On occasion, you may experience a mild rash up to 24 hours after the test. This is not dangerous. If this occurs, you can take Benadryl 25 mg and increase your fluid intake. If you experience trouble breathing, this can be serious. If it is severe call 911 IMMEDIATELY. If it is mild, please call our office. If you take any of this medication: e instructMetformin, please do not take 48 hours after completing test unless otherwised.  We will call to schedule your test 2-4 weeks out understanding that some insurance companies will need an authorization prior to the service being performed.   For non-scheduling related questions, please contact the cardiac imaging nurse navigator should you have any questions/concerns: Marchia Bond, Cardiac Imaging Nurse Navigator [name], Cardiac Imaging Nurse Navigator [facility] Direct Office Dial: [phone]   For scheduling needs, including cancellations and rescheduling, please call [place], [phone].

## 2022-10-08 NOTE — Progress Notes (Signed)
Cardiology Office Note  Date: 10/08/2022   ID: Travis Paul, DOB 04/28/1966, MRN [id number]  PCP:  Curlene Labrum, MD  Cardiologist:  None Electrophysiologist:  None   Reason for Office Visit: Follow-up of chest pain   History of Present Illness: Travis Paul is a 57 y.o. male known to have HTN, DM 2, HLD presented to cardiology clinic for follow-up visit.  Patient was initially referred to cardiology clinic for chest pain evaluation in 2018. NM stress test performed at [facility] on 01/04/2017 showed prior inferior wall infarct with no evidence of ischemia. Echocardiogram from 01/13/2017 showed LVEF send 75%, mild LVH and grade 1 diastolic dysfunction. Pulmonary pressures were within normal limits. He again underwent NM stress test in 2021 due to recurrent chest pain that showed possible inferior infarct but no evidence of ischemia. Patient was extremely worried about the scar/infarct in the inferior wall and presence of old myocardial infarction diagnosis on his chart. He was told around the time of his kidney stone surgery that he had a old heart attack and the patient was unaware that he had a heart attack. He presents today for follow-up visit. He continues to have atypical chest pain, pain in the right side of his chest last throughout the day and stays for 2 to 3 days before it resolves completely and recurs in 2 to 3 weeks. Has some SOB but denies any major DOE. Denies any dizziness, lightheadedness, syncope, leg swelling. He is a Administrator by profession, he said he left his house in [place] in October 2023 and came home recently. And hence he wants to get all his appointments lined up and tests scheduled.   Past Medical History:  Diagnosis Date   Achilles tendinitis, right leg    Chronic systolic heart failure (HCC)    Diabetes mellitus without complication (HCC)    Hyperlipidemia    Hypertension    Idiopathic gout, unspecified site    Mallet  finger of left hand    Old myocardial infarction     Past Surgical History:  Procedure Laterality Date   COLONOSCOPY N/A 07/21/2019   Procedure: COLONOSCOPY;  Surgeon: Daneil Dolin, MD;  [facility];  Service: Endoscopy;  Laterality: N/A;  9:30   CYSTOSCOPY WITH RETROGRADE PYELOGRAM, URETEROSCOPY AND STENT PLACEMENT Right 06/17/2022   Procedure: CYSTOSCOPY WITH RETROGRADE PYELOGRAM, URETEROSCOPY AND STENT PLACEMENT;  Surgeon: Primus Bravo., MD;  Location: [facility];  Service: Urology;  Laterality: Right;   HOLMIUM LASER APPLICATION Right 95/02/2129   Procedure: HOLMIUM LASER APPLICATION;  Surgeon: Primus Bravo., MD;  Location: [facility];  Service: Urology;  Laterality: Right;   LUNG SURGERY  07/05/2019   from a lung injury   NO PAST SURGERIES     POLYPECTOMY  07/21/2019   Procedure: POLYPECTOMY;  Surgeon: Daneil Dolin, MD;  [facility];  Service: Endoscopy;;  colon    Current Outpatient Medications  Medication Sig Dispense Refill   alfuzosin (UROXATRAL) 10 MG 24 hr tablet Take 1 tablet (10 mg total) by mouth daily with breakfast. 30 tablet 11   amLODipine (NORVASC) 10 MG tablet Take 1 tablet (10 mg total) by mouth daily. 90 tablet 3   aspirin EC 81 MG tablet Take 1 tablet (81 mg total) by mouth daily.     benazepril (LOTENSIN) 40 MG tablet Take 1 tablet (40 mg total) by mouth daily. 90 tablet 3   Biotin w/ Vitamins C & E (  HAIR/SKIN/NAILS PO) Take 1 tablet by mouth daily.     fenofibrate 160 MG tablet Take 1 tablet (160 mg total) by mouth daily. 90 tablet 3   isosorbide mononitrate (IMDUR) 30 MG 24 hr tablet Take 1 tablet (30 mg total) by mouth daily. 30 tablet 0   metFORMIN (GLUCOPHAGE) 1000 MG tablet Take 1,000 mg by mouth 2 (two) times daily with a meal.      Multiple Vitamin (MULTIVITAMIN WITH MINERALS) TABS tablet Take 1 tablet by mouth daily.     phenazopyridine (PYRIDIUM) 200 MG tablet Take 1 tablet (200 mg total) by mouth 3 (three) times  daily as needed for pain. 20 tablet 0   rosuvastatin (CRESTOR) 10 MG tablet Take 1 tablet (10 mg total) by mouth daily. 90 tablet 3   HYDROmorphone (DILAUDID) 2 MG tablet Take 2 mg by mouth every 4 (four) hours as needed for pain. (Patient not taking: Reported on 10/08/2022)     nitroGLYCERIN (NITROSTAT) 0.4 MG SL tablet Place 1 tablet (0.4 mg total) under the tongue every 5 (five) minutes as needed for chest pain. (Patient not taking: Reported on 10/08/2022) 25 tablet 3   ondansetron (ZOFRAN-ODT) 4 MG disintegrating tablet Take 4-8 mg by mouth every 8 (eight) hours as needed for nausea/vomiting. (Patient not taking: Reported on 10/08/2022)     No current facility-administered medications for this visit.   Allergies:  Morphine and related   Social History: The patient  reports that he quit smoking about 12 years ago. His smoking use included cigarettes. He smoked an average of 1 pack per day. He has never used smokeless tobacco. He reports current alcohol use. He reports that he does not use drugs.   Family History: The patient's family history includes Colon polyps in his paternal uncle; Liver cancer in his mother.   ROS:  Please see the history of present illness. Otherwise, complete review of systems is positive for none.  All other systems are reviewed and negative.   Physical Exam: VS:  BP 112/68   Pulse 92   Ht 6' (1.829 m)   Wt 242 lb 9.6 oz (110 kg)   SpO2 97%   BMI 32.90 kg/m , BMI Body mass index is 32.9 kg/m.  Wt Readings from Last 3 Encounters:  10/08/22 242 lb 9.6 oz (110 kg)  06/24/22 240 lb (108.9 kg)  06/18/22 240 lb (108.9 kg)    General: Patient appears comfortable at rest. HEENT: Conjunctiva and lids normal, oropharynx clear with moist mucosa. Neck: Supple, no elevated JVP or carotid bruits, no thyromegaly. Lungs: Clear to auscultation, nonlabored breathing at rest. Cardiac: Regular rate and rhythm, no S3 or significant systolic murmur, no pericardial rub. Abdomen:  Soft, nontender, no hepatomegaly, bowel sounds present, no guarding or rebound. Extremities: No pitting edema, distal pulses 2+. Skin: Warm and dry. Musculoskeletal: No kyphosis. Neuropsychiatric: Alert and oriented x3, affect grossly appropriate.  ECG:  An ECG dated 10/08/2022 was personally reviewed today and demonstrated:  Normal sinus rhythm and no ST changes.  Recent Labwork: 06/15/2022: BUN 12; Creatinine, Ser 1.11; Potassium 3.3; Sodium 139  No results found for: "CHOL", "TRIG", "HDL", "CHOLHDL", "VLDL", "LDLCALC", "LDLDIRECT"  Other Studies Reviewed Today: Echocardiogram in 2021 Normal LVEF Grade 1 diastolic dysfunction No valve abnormalities  NM stress test in 2021 Small, moderate intensity, fixed inferior defect most prominent on rest imaging and in the mid to apical region with normal wall motion suggesting soft tissue attenuation, although scar cannot be completely excluded.  Assessment  and Plan: Patient is a 57 year old M known to have HTN, DM 2, HLD presented to cardiology clinic for follow-up visit of chest pain.  # Atypical chest pain, rule out significant CAD -Patient underwent echocardiogram and NM stress test in 2018 and 2021 which showed normal LVEF and no valve abnormalities; inferior infarct in 2018 and less likely scar in 2021 NM stress test. No evidence of ischemia. EKG today showed normal sinus rhythm and no evidence of ST-T changes. No Q waves present on the EKG. Obtain CTA cardiac for definitive evaluation of CAD due to history of DM 2.  # HTN, controlled -Continue amlodipine 10 mg once daily, benazepril 40 mg once daily, Imdur 30 mg once daily.  Management of HTN per PCP.  # HLD -Continue rosuvastatin 10 mg nightly.  No myalgias.  I have spent a total of 33 minutes with patient reviewing chart, EKGs, labs and examining patient as well as establishing an assessment and plan that was discussed with the patient.  > 50% of time was spent in direct patient care.       Medication Adjustments/Labs and Tests Ordered: Current medicines are reviewed at length with the patient today.  Concerns regarding medicines are outlined above.   Tests Ordered: No orders of the defined types were placed in this encounter.   Medication Changes: No orders of the defined types were placed in this encounter.   Disposition:  Follow up  6 months  Signed Latroya Ng Fidel Levy, MD, 10/08/2022 1:06 PM    [facility], [place], [place] [address]

## 2022-10-12 ENCOUNTER — Telehealth (HOSPITAL_COMMUNITY): Payer: Self-pay | Admitting: *Deleted

## 2022-10-12 NOTE — Telephone Encounter (Signed)
Reaching out to patient to offer assistance regarding upcoming cardiac imaging study; pt verbalizes understanding of appt date/time, parking situation and where to check in, pre-test NPO status and medications ordered, and verified current allergies; name and call back number provided for further questions should they arise  Travis Clement RN Navigator Cardiac Imaging [facility] [phone] office [phone] cell  Patient to take 100mg  metoprolol two hours prior to his cardiac CT scan. He is aware to arrive at 1:30 PM.

## 2022-10-13 ENCOUNTER — Ambulatory Visit (HOSPITAL_COMMUNITY)
Admission: RE | Admit: 2022-10-13 | Discharge: 2022-10-13 | Disposition: A | Discharge status: Home / Self Care | Payer: 59 | Source: Ambulatory Visit | Attending: Internal Medicine | Admitting: Internal Medicine

## 2022-10-13 DIAGNOSIS — R079 Chest pain, unspecified: Secondary | ICD-10-CM | POA: Insufficient documentation

## 2022-10-13 LAB — POCT I-STAT CREATININE: Creatinine, Ser: 1.2 mg/dL (ref 0.61–1.24)

## 2022-10-13 MED ORDER — NITROGLYCERIN 0.4 MG SL SUBL
0.8000 mg | SUBLINGUAL_TABLET | SUBLINGUAL | Status: DC | PRN
  Administered 2022-10-13: 0.8 mg via SUBLINGUAL

## 2022-10-13 MED ORDER — IOHEXOL 350 MG/ML SOLN
100.0000 mL | Freq: Once | INTRAVENOUS | Status: AC | PRN
  Administered 2022-10-13: 100 mL via INTRAVENOUS

## 2022-10-13 MED ORDER — NITROGLYCERIN 0.4 MG SL SUBL
SUBLINGUAL_TABLET | SUBLINGUAL | Status: AC
  Filled 2022-10-13: qty 2

## 2022-10-16 ENCOUNTER — Telehealth: Payer: Self-pay | Admitting: Internal Medicine

## 2022-10-16 NOTE — Telephone Encounter (Signed)
Patient called to get test results.

## 2022-10-19 NOTE — Telephone Encounter (Signed)
Patient informed. Copy sent to PCP °

## 2022-10-26 IMAGING — CT CT CHEST LUNG CANCER SCREENING LOW DOSE W/O CM
1 of 2 series · 10 of 20 positions shown, 13 images · non-contrast
Comparison: None.

CLINICAL DATA: 55-year-old male with 30 pack-year history of
smoking. Lung cancer screening.



[ct lung segmentation data · axial · 0.70mm/px · z∈[+1186,+1186]mm · 10 of 344 frames shown]
[frame 1/344  mediastinal]
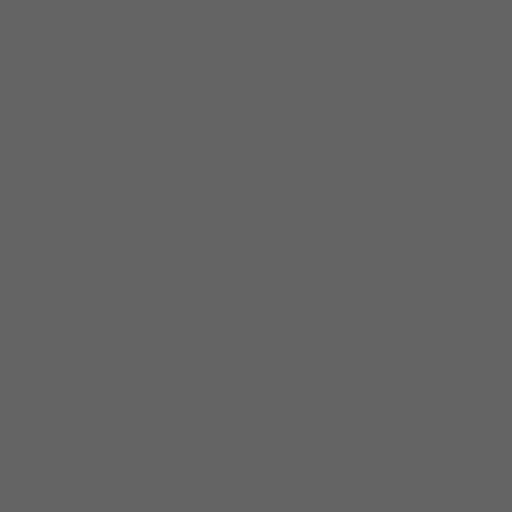
[frame 1/344  lung]
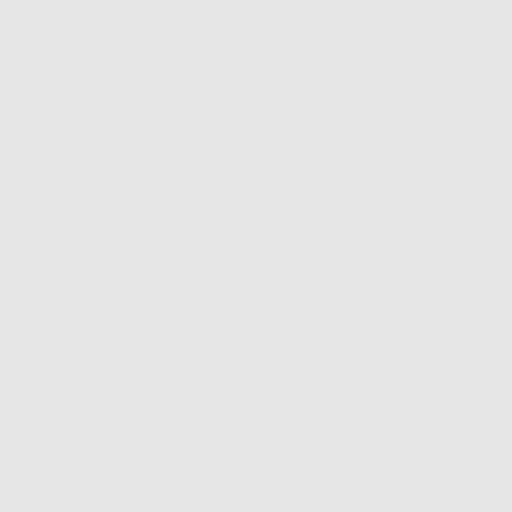
[frame 39/344  lung]
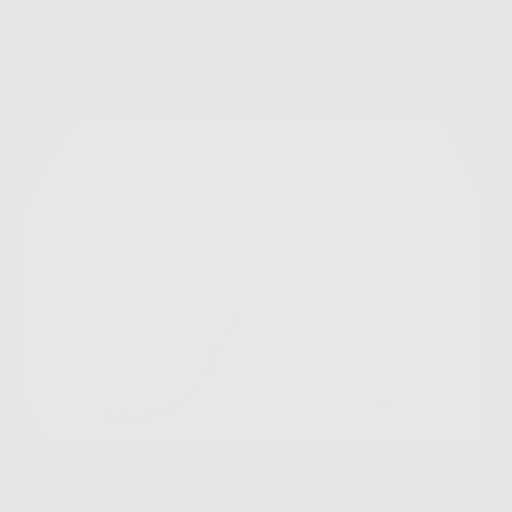
[frame 77/344  lung]
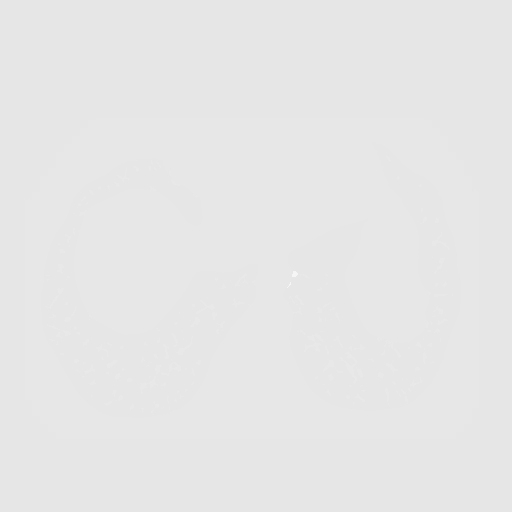
[frame 115/344  lung]
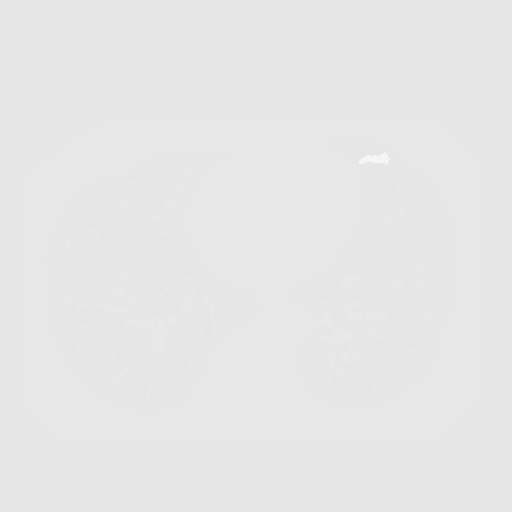
[frame 153/344  mediastinal]
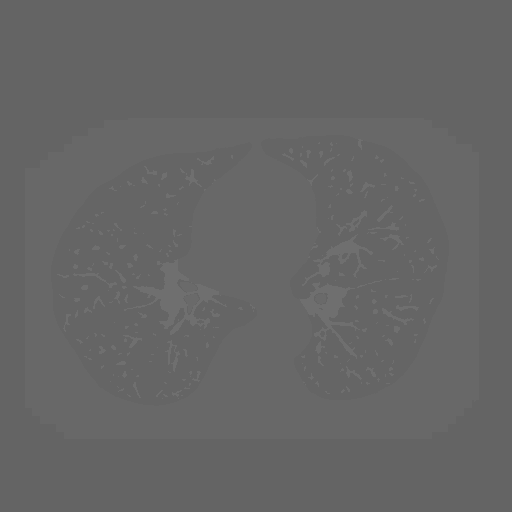
[frame 153/344  lung]
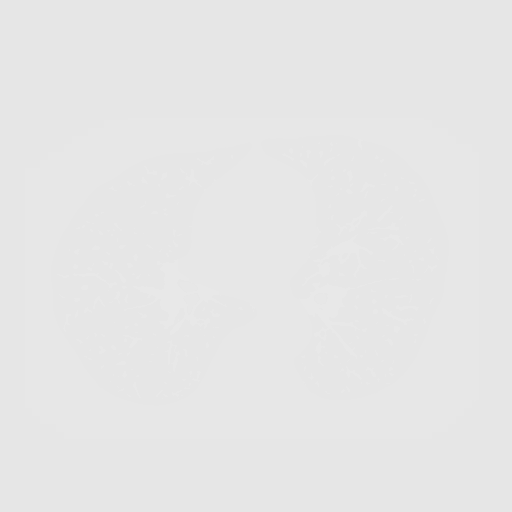
[frame 191/344  lung]
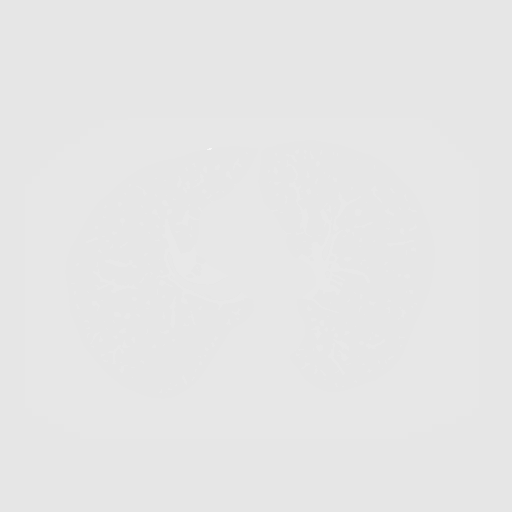
[frame 229/344  lung]
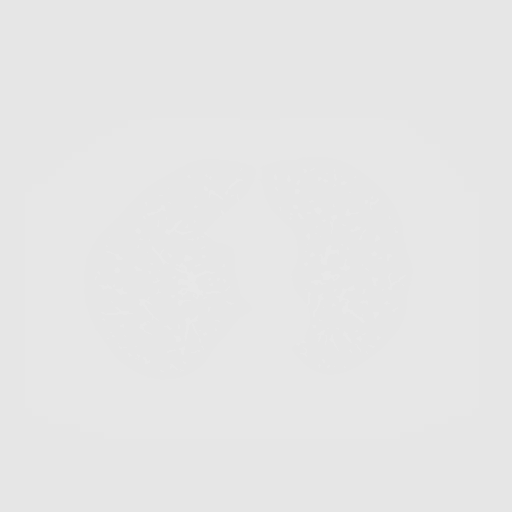
[frame 267/344  lung]
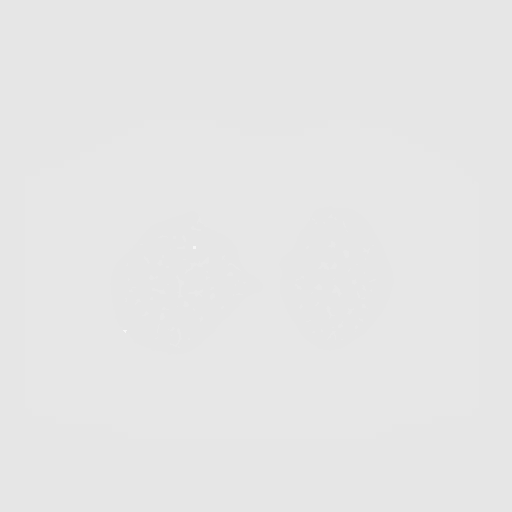
[frame 305/344  mediastinal]
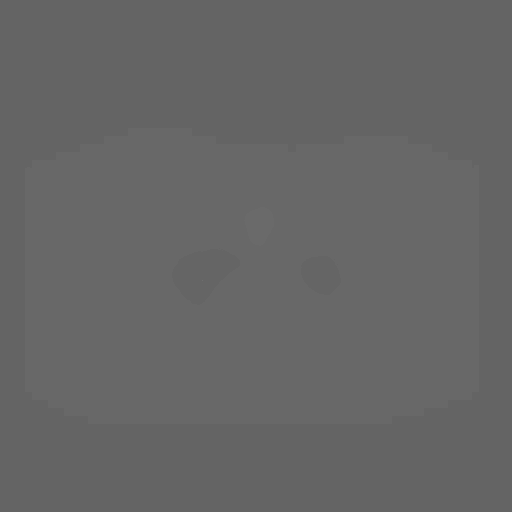
[frame 305/344  lung]
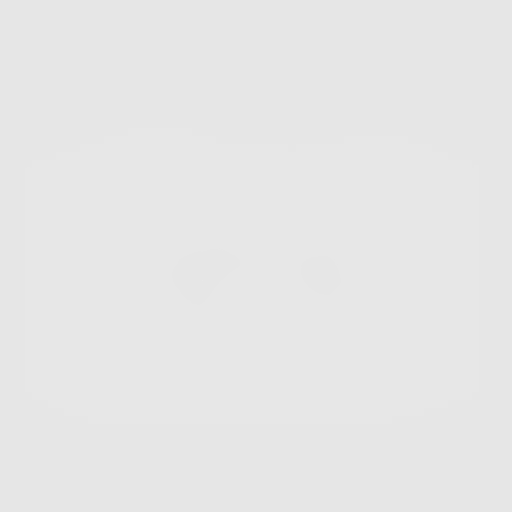
[frame 344/344  lung]
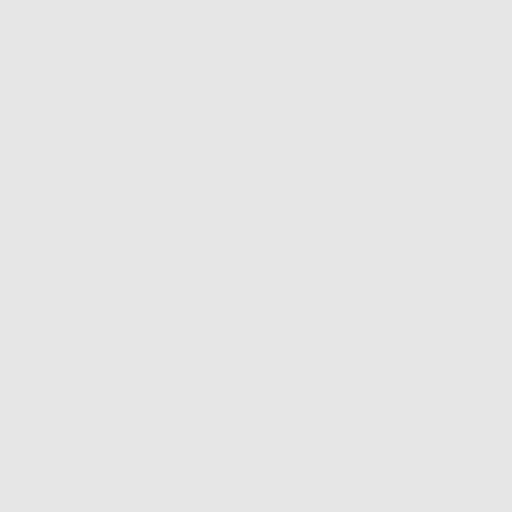

[10 of 20 positions shown; findings below may reference images not displayed]

FINDINGS: Cardiovascular: The heart size is normal. No substantial pericardial
effusion. No thoracic aortic aneurysm. No substantial
atherosclerosis of the thoracic aorta.

Mediastinum/Nodes: No mediastinal lymphadenopathy. 10 mm short axis
precarinal lymph node is upper normal for size. No evidence for
gross hilar lymphadenopathy although assessment is limited by the
lack of intravenous contrast on the current study. The esophagus has
normal imaging features. There is no axillary lymphadenopathy.

Lungs/Pleura: Centrilobular and paraseptal emphysema evident.
Calcified granuloma noted in the right lung. 10.2 mm multilobular
central left lower lobe pulmonary nodule is evident on image 191 of
series 4a. No focal airspace consolidation. No pleural effusion.

Upper Abdomen: Unremarkable.

Musculoskeletal: No worrisome lytic or sclerotic osseous
abnormality.
IMPRESSION: 1. Lung-RADS 4A, suspicious. Follow up low-dose chest CT without
contrast in 3 months (please use the following order, "CT CHEST LCS
NODULE FOLLOW-UP W/O CM") is recommended. Alternatively, PET may be
considered when there is a solid component 8mm or larger.
2.  Emphysema. ([id number].Y)

These results will be called to the ordering clinician or
representative by the Radiologist Assistant, and communication
documented in the PACS or [REDACTED].

## 2022-11-30 ENCOUNTER — Other Ambulatory Visit: Payer: Self-pay | Admitting: *Deleted

## 2022-11-30 MED ORDER — ISOSORBIDE MONONITRATE ER 30 MG PO TB24: 30.0000 mg | ORAL_TABLET | Freq: Every day | ORAL | 2 refills | Status: DC

## 2022-12-01 ENCOUNTER — Other Ambulatory Visit: Payer: Self-pay | Admitting: Urology

## 2022-12-01 DIAGNOSIS — N138 Other obstructive and reflux uropathy: Secondary | ICD-10-CM

## 2023-01-20 ENCOUNTER — Ambulatory Visit: Payer: 59 | Admitting: Urology

## 2023-03-19 ENCOUNTER — Other Ambulatory Visit: Payer: Self-pay | Admitting: Internal Medicine

## 2023-03-19 MED ORDER — FENOFIBRATE 160 MG PO TABS: 160.0000 mg | ORAL_TABLET | Freq: Every day | ORAL | 3 refills | Status: DC

## 2023-03-22 ENCOUNTER — Encounter: Payer: 59 | Admitting: Internal Medicine

## 2023-03-22 NOTE — Progress Notes (Signed)
Erroneous encounter - please disregard.

## 2023-03-23 ENCOUNTER — Encounter: Payer: Self-pay | Admitting: Internal Medicine

## 2023-03-31 DIAGNOSIS — J449 Chronic obstructive pulmonary disease, unspecified: Secondary | ICD-10-CM | POA: Diagnosis not present

## 2023-03-31 DIAGNOSIS — E538 Deficiency of other specified B group vitamins: Secondary | ICD-10-CM | POA: Diagnosis not present

## 2023-03-31 DIAGNOSIS — Z125 Encounter for screening for malignant neoplasm of prostate: Secondary | ICD-10-CM | POA: Diagnosis not present

## 2023-03-31 DIAGNOSIS — Z0001 Encounter for general adult medical examination with abnormal findings: Secondary | ICD-10-CM | POA: Diagnosis not present

## 2023-03-31 DIAGNOSIS — E782 Mixed hyperlipidemia: Secondary | ICD-10-CM | POA: Diagnosis not present

## 2023-03-31 DIAGNOSIS — E1165 Type 2 diabetes mellitus with hyperglycemia: Secondary | ICD-10-CM | POA: Diagnosis not present

## 2023-03-31 DIAGNOSIS — Z6831 Body mass index (BMI) 31.0-31.9, adult: Secondary | ICD-10-CM | POA: Diagnosis not present

## 2023-03-31 DIAGNOSIS — E7849 Other hyperlipidemia: Secondary | ICD-10-CM | POA: Diagnosis not present

## 2023-03-31 DIAGNOSIS — M1000 Idiopathic gout, unspecified site: Secondary | ICD-10-CM | POA: Diagnosis not present

## 2023-03-31 DIAGNOSIS — I10 Essential (primary) hypertension: Secondary | ICD-10-CM | POA: Diagnosis not present

## 2023-03-31 DIAGNOSIS — I700 Atherosclerosis of aorta: Secondary | ICD-10-CM | POA: Diagnosis not present

## 2023-03-31 DIAGNOSIS — R911 Solitary pulmonary nodule: Secondary | ICD-10-CM | POA: Diagnosis not present

## 2023-03-31 DIAGNOSIS — N401 Enlarged prostate with lower urinary tract symptoms: Secondary | ICD-10-CM | POA: Diagnosis not present

## 2023-05-01 ENCOUNTER — Other Ambulatory Visit: Payer: Self-pay | Admitting: Urology

## 2023-05-01 DIAGNOSIS — N138 Other obstructive and reflux uropathy: Secondary | ICD-10-CM

## 2023-06-06 ENCOUNTER — Other Ambulatory Visit: Payer: Self-pay | Admitting: Urology

## 2023-06-06 DIAGNOSIS — N138 Other obstructive and reflux uropathy: Secondary | ICD-10-CM

## 2023-07-02 ENCOUNTER — Other Ambulatory Visit: Payer: Self-pay | Admitting: Urology

## 2023-07-02 DIAGNOSIS — N138 Other obstructive and reflux uropathy: Secondary | ICD-10-CM

## 2023-10-20 DIAGNOSIS — N529 Male erectile dysfunction, unspecified: Secondary | ICD-10-CM | POA: Diagnosis not present

## 2024-02-02 DIAGNOSIS — Z6831 Body mass index (BMI) 31.0-31.9, adult: Secondary | ICD-10-CM | POA: Diagnosis not present

## 2024-02-02 DIAGNOSIS — S99922A Unspecified injury of left foot, initial encounter: Secondary | ICD-10-CM | POA: Diagnosis not present

## 2024-02-02 DIAGNOSIS — M79675 Pain in left toe(s): Secondary | ICD-10-CM | POA: Diagnosis not present

## 2024-04-13 ENCOUNTER — Other Ambulatory Visit: Payer: Self-pay | Admitting: Internal Medicine

## 2024-04-19 ENCOUNTER — Other Ambulatory Visit (HOSPITAL_COMMUNITY): Payer: Self-pay | Admitting: Family Medicine

## 2024-04-19 ENCOUNTER — Ambulatory Visit (HOSPITAL_COMMUNITY)
Admission: RE | Admit: 2024-04-19 | Discharge: 2024-04-19 | Disposition: A | Discharge status: Home / Self Care | Source: Ambulatory Visit | Attending: Family Medicine | Admitting: Family Medicine

## 2024-04-19 DIAGNOSIS — R1031 Right lower quadrant pain: Secondary | ICD-10-CM

## 2024-04-19 DIAGNOSIS — M1611 Unilateral primary osteoarthritis, right hip: Secondary | ICD-10-CM | POA: Diagnosis not present

## 2024-06-02 DIAGNOSIS — R1031 Right lower quadrant pain: Secondary | ICD-10-CM | POA: Diagnosis not present

## 2024-06-02 DIAGNOSIS — E119 Type 2 diabetes mellitus without complications: Secondary | ICD-10-CM | POA: Diagnosis not present

## 2024-06-02 DIAGNOSIS — R809 Proteinuria, unspecified: Secondary | ICD-10-CM | POA: Diagnosis not present

## 2024-06-13 ENCOUNTER — Encounter (INDEPENDENT_AMBULATORY_CARE_PROVIDER_SITE_OTHER): Payer: Self-pay | Admitting: *Deleted

## 2024-07-04 ENCOUNTER — Encounter: Payer: Self-pay | Admitting: Gastroenterology

## 2024-07-05 ENCOUNTER — Telehealth (INDEPENDENT_AMBULATORY_CARE_PROVIDER_SITE_OTHER): Payer: Self-pay

## 2024-07-05 ENCOUNTER — Encounter: Payer: Self-pay | Admitting: Gastroenterology

## 2024-07-05 ENCOUNTER — Ambulatory Visit (INDEPENDENT_AMBULATORY_CARE_PROVIDER_SITE_OTHER): Admitting: Gastroenterology

## 2024-07-05 VITALS — BP 150/93 | HR 75 | Temp 97.3°F | Ht 72.0 in | Wt 230.5 lb

## 2024-07-05 DIAGNOSIS — Z860100 Personal history of colon polyps, unspecified: Secondary | ICD-10-CM

## 2024-07-05 MED ORDER — PEG 3350-KCL-NA BICARB-NACL 420 G PO SOLR: 4000.0000 mL | Freq: Once | ORAL | 0 refills | Status: AC

## 2024-07-05 NOTE — Progress Notes (Signed)
 58 y.o. male presenting today at the request of Loring Lye, MD for consultation prior to surveillance colonoscopy. Past medical history significant for HTN, DM, HLD, gout,. No prior hx of CAD. He was asked to come in due to possible history of constipation. He denies this.   Has no GI concerns today whatsoever. No FH colon cancer. Personal hx of polyps in the past, hyperplastic polyp in 2020, and on recall for 2025.   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  No need for office visit. Will cancel today's visit and triage today.   No metformin day of procedure.  ASA 2.   Proceed with colonoscopy by Dr. Shaaron in near future.    Past procedures:  Colonoscopy 2020: pancolonic diverticulosis, one 5 mm splenic flexure polyp s/p removal, otherwise normal. (Hyperplastic). Recommend 5 year surveillance due to hx of polyps in past (path unknown in 2012).   2012 [facility] colonoscopy: sessile polyp  No FH colon cancer

## 2024-07-05 NOTE — Telephone Encounter (Signed)
 Spoke with patient, scheduled TCS for 07/19/2024. Rx sent to pharmacy. Instructions given to patient.

## 2024-07-05 NOTE — Telephone Encounter (Signed)
 PA not required.

## 2024-07-05 NOTE — Progress Notes (Deleted)
 Gastroenterology Office Note    Referring Provider: Loring Lye, MD Primary Care Physician:  Loring Lye, MD  Primary GI: Dr Cindie   Chief Complaint   Chief Complaint  Patient presents with   New Patient (Initial Visit)    Patient here today as a new patient in need of a TCS. Patient denies any current gi related issues. Was brought in because the Triage stated he uses laxatives due to constipation, which patient says is not true.     History of Present Illness   Travis Paul is a 57 y.o. male presenting today at the request of Loring Lye, MD for consultation prior to surveillance colonoscopy. Past medical history significant for HTN, DM, HLD, gout,     Colonoscopy 2020: pancolonic diverticulosis, one 5 mm splenic flexure polyp s/p removal, otherwise normal. (Hyperplastic). Recommend 5 year surveillance due to hx of polyps in past (path unknown in 2012).   2012 [facility] colonoscopy: sessile polyp  No FH colon cancer    Past Medical History:  Diagnosis Date   Achilles tendinitis, right leg    Chronic systolic heart failure (HCC)    Diabetes mellitus without complication (HCC)    Hyperlipidemia    Hypertension    Idiopathic gout, unspecified site    Mallet finger of left hand    Old myocardial infarction     Past Surgical History:  Procedure Laterality Date   COLONOSCOPY N/A 07/21/2019   Procedure: COLONOSCOPY;  Surgeon: Shaaron Lamar HERO, MD;  [facility];  Service: Endoscopy;  Laterality: N/A;  9:30   CYSTOSCOPY WITH RETROGRADE PYELOGRAM, URETEROSCOPY AND STENT PLACEMENT Right 06/17/2022   Procedure: CYSTOSCOPY WITH RETROGRADE PYELOGRAM, URETEROSCOPY AND STENT PLACEMENT;  Surgeon: Roseann Adine JINNY., MD;  Location: [facility];  Service: Urology;  Laterality: Right;   HOLMIUM LASER APPLICATION Right 06/17/2022   Procedure: HOLMIUM LASER APPLICATION;  Surgeon: Roseann Adine JINNY., MD;  Location: [facility];  Service: Urology;  Laterality:  Right;   LUNG SURGERY  07/05/2019   from a lung injury   NO PAST SURGERIES     POLYPECTOMY  07/21/2019   Procedure: POLYPECTOMY;  Surgeon: Shaaron Lamar HERO, MD;  [facility];  Service: Endoscopy;;  colon    Current Outpatient Medications  Medication Sig Dispense Refill   amLODipine  (NORVASC ) 10 MG tablet Take 1 tablet (10 mg total) by mouth daily. 90 tablet 3   benazepril  (LOTENSIN ) 40 MG tablet Take 1 tablet (40 mg total) by mouth daily. 90 tablet 3   Biotin w/ Vitamins C & E (HAIR/SKIN/NAILS PO) Take 1 tablet by mouth daily.     fenofibrate  160 MG tablet Take 1 tablet by mouth once daily 15 tablet 0   metFORMIN (GLUCOPHAGE) 1000 MG tablet Take 1,000 mg by mouth 2 (two) times daily with a meal.      nitroGLYCERIN  (NITROSTAT ) 0.4 MG SL tablet Place 1 tablet (0.4 mg total) under the tongue every 5 (five) minutes as needed for chest pain. 25 tablet 3   rosuvastatin  (CRESTOR ) 10 MG tablet Take 1 tablet (10 mg total) by mouth daily. 90 tablet 3   No current facility-administered medications for this visit.    Allergies as of 07/05/2024 - Review Complete 07/05/2024  Allergen Reaction Noted   Morphine  and codeine Itching 06/09/2022    Family History  Problem Relation Age of Onset   Liver cancer Mother    Colon polyps Paternal Uncle     Social History   Socioeconomic  History   Marital status: Single    Spouse name: Not on file   Number of children: Not on file   Years of education: Not on file   Highest education level: Not on file  Occupational History   Not on file  Tobacco Use   Smoking status: Former    Current packs/day: 0.00    Types: Cigarettes    Quit date: 2012    Years since quitting: 13.8   Smokeless tobacco: Never  Vaping Use   Vaping status: Never Used  Substance and Sexual Activity   Alcohol use: Yes    Comment: 1 beer on weekends   Drug use: Never   Sexual activity: Not on file  Other Topics Concern   Not on file  Social History Narrative    Not on file   Social Drivers of Health   Financial Resource Strain: Not on file  Food Insecurity: Not on file  Transportation Needs: Not on file  Physical Activity: Not on file  Stress: Not on file  Social Connections: Not on file  Intimate Partner Violence: Not on file     Review of Systems   Gen: Denies any fever, chills, fatigue, weight loss, lack of appetite.  CV: Denies chest pain, heart palpitations, peripheral edema, syncope.  Resp: Denies shortness of breath at rest or with exertion. Denies wheezing or cough.  GI: Denies dysphagia or odynophagia. Denies jaundice, hematemesis, fecal incontinence. GU : Denies urinary burning, urinary frequency, urinary hesitancy MS: Denies joint pain, muscle weakness, cramps, or limitation of movement.  Derm: Denies rash, itching, dry skin Psych: Denies depression, anxiety, memory loss, and confusion Heme: Denies bruising, bleeding, and enlarged lymph nodes.   Physical Exam   BP (!) 150/93 (BP Location: Right Arm, Patient Position: Sitting, Cuff Size: Large)   Pulse 75   Temp (!) 97.3 F (36.3 C) (Temporal)   Ht 6' (1.829 m)   Wt 230 lb 8 oz (104.6 kg)   BMI 31.26 kg/m  General:   Alert and oriented. Pleasant and cooperative. Well-nourished and well-developed.  Head:  Normocephalic and atraumatic. Eyes:  Without icterus Ears:  Normal auditory acuity. Lungs:  Clear to auscultation bilaterally.  Heart:  S1, S2 present without murmurs appreciated.  Abdomen:  +BS, soft, non-tender and non-distended. No HSM noted. No guarding or rebound. No masses appreciated.  Rectal:  Deferred  Msk:  Symmetrical without gross deformities. Normal posture. Extremities:  Without edema. Neurologic:  Alert and  oriented x4;  grossly normal neurologically. Skin:  Intact without significant lesions or rashes. Psych:  Alert and cooperative. Normal mood and affect.   Assessment   Travis Paul is a 58 y.o. male presenting today with    PLAN      Therisa MICAEL Stager, PhD, ANP-BC [facility]

## 2024-07-14 ENCOUNTER — Other Ambulatory Visit: Payer: Self-pay

## 2024-07-14 ENCOUNTER — Encounter (HOSPITAL_COMMUNITY)
Admission: RE | Admit: 2024-07-14 | Discharge: 2024-07-14 | Disposition: A | Discharge status: Home / Self Care | Source: Ambulatory Visit | Attending: Internal Medicine | Admitting: Internal Medicine

## 2024-07-14 ENCOUNTER — Encounter (HOSPITAL_COMMUNITY): Payer: Self-pay

## 2024-07-14 NOTE — Progress Notes (Signed)
 Spoke w/ via phone for pre-op interview Viraaj Vorndran Lab needs dos CBG        Lab results------ COVID test -----patient states asymptomatic no test needed Arrive at 0815 NPO after lunch on 07/17/24 NO Solid Food.  Clear liquids from MN until 0615 Pre-Surgery Ensure or G2:  Med rec completed Medications to take morning of surgery amlodipine  Diabetic medication hold metformin    Patient instructed no nail polish to be worn day of surgery Patient instructed to bring photo id and insurance card day of surgery Patient aware to have Driver (ride ) / caregiver    for 24 hours after surgery -  Patient Special Instructions ----- Pre-Op special Instructions -----  Patient verbalized understanding of instructions that were given at this phone interview. Patient denies chest pain, sob, fever, cough at the interview.

## 2024-07-19 ENCOUNTER — Encounter (HOSPITAL_COMMUNITY)
Admission: RE | Disposition: A | Discharge status: Home / Self Care | Payer: Self-pay | Source: Home / Self Care | Attending: Internal Medicine

## 2024-07-19 ENCOUNTER — Other Ambulatory Visit: Payer: Self-pay

## 2024-07-19 ENCOUNTER — Ambulatory Visit (HOSPITAL_COMMUNITY): Admitting: Certified Registered"

## 2024-07-19 ENCOUNTER — Encounter (HOSPITAL_COMMUNITY): Payer: Self-pay | Admitting: Internal Medicine

## 2024-07-19 ENCOUNTER — Ambulatory Visit (HOSPITAL_COMMUNITY)
Admission: RE | Admit: 2024-07-19 | Discharge: 2024-07-19 | Disposition: A | Discharge status: Home / Self Care | Attending: Internal Medicine | Admitting: Internal Medicine

## 2024-07-19 DIAGNOSIS — D123 Benign neoplasm of transverse colon: Secondary | ICD-10-CM | POA: Diagnosis not present

## 2024-07-19 DIAGNOSIS — K635 Polyp of colon: Secondary | ICD-10-CM | POA: Diagnosis not present

## 2024-07-19 DIAGNOSIS — Z800 Family history of malignant neoplasm of digestive organs: Secondary | ICD-10-CM | POA: Diagnosis not present

## 2024-07-19 DIAGNOSIS — K621 Rectal polyp: Secondary | ICD-10-CM | POA: Diagnosis not present

## 2024-07-19 DIAGNOSIS — I10 Essential (primary) hypertension: Secondary | ICD-10-CM | POA: Insufficient documentation

## 2024-07-19 DIAGNOSIS — Z1211 Encounter for screening for malignant neoplasm of colon: Secondary | ICD-10-CM | POA: Insufficient documentation

## 2024-07-19 DIAGNOSIS — Z7984 Long term (current) use of oral hypoglycemic drugs: Secondary | ICD-10-CM | POA: Diagnosis not present

## 2024-07-19 DIAGNOSIS — Z79899 Other long term (current) drug therapy: Secondary | ICD-10-CM | POA: Insufficient documentation

## 2024-07-19 DIAGNOSIS — I252 Old myocardial infarction: Secondary | ICD-10-CM | POA: Insufficient documentation

## 2024-07-19 DIAGNOSIS — E119 Type 2 diabetes mellitus without complications: Secondary | ICD-10-CM

## 2024-07-19 DIAGNOSIS — K5730 Diverticulosis of large intestine without perforation or abscess without bleeding: Secondary | ICD-10-CM | POA: Insufficient documentation

## 2024-07-19 DIAGNOSIS — Z860100 Personal history of colon polyps, unspecified: Secondary | ICD-10-CM | POA: Diagnosis not present

## 2024-07-19 DIAGNOSIS — Z87891 Personal history of nicotine dependence: Secondary | ICD-10-CM | POA: Insufficient documentation

## 2024-07-19 HISTORY — PX: COLONOSCOPY: SHX5424

## 2024-07-19 LAB — GLUCOSE, CAPILLARY: Glucose-Capillary: 98 mg/dL (ref 70–99)

## 2024-07-19 SURGERY — COLONOSCOPY
Anesthesia: General

## 2024-07-19 MED ORDER — LACTATED RINGERS IV SOLN: INTRAVENOUS | Status: DC | PRN

## 2024-07-19 MED ORDER — PROPOFOL 10 MG/ML IV BOLUS
INTRAVENOUS | Status: DC | PRN
  Administered 2024-07-19: 175 ug/kg/min via INTRAVENOUS
  Administered 2024-07-19: 100 mg via INTRAVENOUS

## 2024-07-19 MED ORDER — LIDOCAINE HCL (CARDIAC) PF 100 MG/5ML IV SOSY
PREFILLED_SYRINGE | INTRAVENOUS | Status: DC | PRN
  Administered 2024-07-19: 50 mg via INTRAVENOUS

## 2024-07-19 NOTE — H&P (Signed)
 @LOGO @   Gastroenterology Progress Note    Primary Care Physician:  Loring Lye, MD Primary Gastroenterologist:  Dr. Shaaron  Pre-Procedure History & Physical: HPI:  Travis Paul is a 58 y.o. male here for  surveillance colonoscopy.  Distant history of sessile adenoma removed elsewhere.  Past Medical History:  Diagnosis Date   Achilles tendinitis, right leg    Diabetes mellitus without complication (HCC)    Hyperlipidemia    Hypertension    Idiopathic gout, unspecified site    Mallet finger of left hand    Old myocardial infarction     Past Surgical History:  Procedure Laterality Date   COLONOSCOPY N/A 07/21/2019   Procedure: COLONOSCOPY;  Surgeon: Shaaron Lamar HERO, MD;  [facility];  Service: Endoscopy;  Laterality: N/A;  9:30   CYSTOSCOPY WITH RETROGRADE PYELOGRAM, URETEROSCOPY AND STENT PLACEMENT Right 06/17/2022   Procedure: CYSTOSCOPY WITH RETROGRADE PYELOGRAM, URETEROSCOPY AND STENT PLACEMENT;  Surgeon: Roseann Adine JINNY., MD;  Location: [facility];  Service: Urology;  Laterality: Right;   HOLMIUM LASER APPLICATION Right 06/17/2022   Procedure: HOLMIUM LASER APPLICATION;  Surgeon: Roseann Adine JINNY., MD;  Location: [facility];  Service: Urology;  Laterality: Right;   LUNG SURGERY  07/05/2019   from a lung injury   NO PAST SURGERIES     POLYPECTOMY  07/21/2019   Procedure: POLYPECTOMY;  Surgeon: Shaaron Lamar HERO, MD;  [facility];  Service: Endoscopy;;  colon    Prior to Admission medications   Medication Sig Start Date End Date Taking? Authorizing Provider  amLODipine  (NORVASC ) 10 MG tablet Take 1 tablet (10 mg total) by mouth daily. 10/13/21  Yes Hobart Powell BRAVO, MD  benazepril  (LOTENSIN ) 40 MG tablet Take 1 tablet (40 mg total) by mouth daily. 10/13/21  Yes Hobart Powell BRAVO, MD  Biotin w/ Vitamins C & E (HAIR/SKIN/NAILS PO) Take 1 tablet by mouth daily.   Yes [provider]  fenofibrate  160 MG tablet Take 1 tablet by mouth  once daily 04/14/24  Yes Mallipeddi, Vishnu P, MD  metFORMIN (GLUCOPHAGE) 1000 MG tablet Take 1,000 mg by mouth 2 (two) times daily with a meal.    Yes [provider]  nitroGLYCERIN  (NITROSTAT ) 0.4 MG SL tablet Place 1 tablet (0.4 mg total) under the tongue every 5 (five) minutes as needed for chest pain. 01/07/17  Yes Charls Pearla LABOR, MD  rosuvastatin  (CRESTOR ) 10 MG tablet Take 1 tablet (10 mg total) by mouth daily. 10/13/21  Yes Hobart Powell BRAVO, MD    Allergies as of 07/05/2024 - Review Complete 07/05/2024  Allergen Reaction Noted   Morphine  and codeine Itching 06/09/2022    Family History  Problem Relation Age of Onset   Liver cancer Mother    Colon polyps Paternal Uncle     Social History   Socioeconomic History   Marital status: Single    Spouse name: Not on file   Number of children: Not on file   Years of education: Not on file   Highest education level: Not on file  Occupational History   Not on file  Tobacco Use   Smoking status: Former    Current packs/day: 0.00    Types: Cigarettes    Quit date: 2012    Years since quitting: 13.8   Smokeless tobacco: Never  Vaping Use   Vaping status: Never Used  Substance and Sexual Activity   Alcohol use: Yes    Comment: 1 beer on weekends   Drug use:  Never   Sexual activity: Not on file  Other Topics Concern   Not on file  Social History Narrative   Not on file   Social Drivers of Health   Financial Resource Strain: Not on file  Food Insecurity: Not on file  Transportation Needs: Not on file  Physical Activity: Not on file  Stress: Not on file  Social Connections: Not on file  Intimate Partner Violence: Not on file    Review of Systems   See HPI, otherwise negative ROS  Physical Exam: BP 127/82   Pulse (!) 52   Temp 97.8 F (36.6 C)   Resp 11   SpO2 100%  General:   Alert,  Well-developed, well-nourished, pleasant and cooperative in NAD Neck:  Supple; no masses or thyromegaly. No  significant cervical adenopathy. Lungs:  Clear throughout to auscultation.   No wheezes, crackles, or rhonchi. No acute distress. Heart:  Regular rate and rhythm; no murmurs, clicks, rubs,  or gallops. Abdomen: Non-distended, normal bowel sounds.  Soft and nontender without appreciable mass or hepatosplenomegaly.   Impression/Plan:     58 year old gentleman with a distant history colonic adenoma removed elsewhere here for surveillance examination.  The risks, benefits, limitations, alternatives and imponderables have been reviewed with the patient. Questions have been answered. All parties are agreeable.      Notice: This dictation was prepared with Dragon dictation along with smaller phrase technology. Any transcriptional errors that result from this process are unintentional and may not be corrected upon review.

## 2024-07-19 NOTE — Anesthesia Postprocedure Evaluation (Signed)
 Anesthesia Post Note  Patient: Travis Paul  Procedure(s) Performed: COLONOSCOPY  Patient location during evaluation: Phase II Anesthesia Type: General Level of consciousness: awake and alert Pain management: pain level controlled Vital Signs Assessment: post-procedure vital signs reviewed and stable Respiratory status: spontaneous breathing, nonlabored ventilation and respiratory function stable Cardiovascular status: stable Anesthetic complications: no   There were no known notable events for this encounter.   Last Vitals:  Vitals:   07/19/24 1134 07/19/24 1138  BP: (!) 89/50 115/78  Pulse: (!) 58   Resp: 11   Temp:    SpO2: 98%     Last Pain:  Vitals:   07/19/24 1138  TempSrc:   PainSc: 0-No pain                 [name] [name]

## 2024-07-19 NOTE — Op Note (Signed)
 [facility] Patient Name: Travis Paul Procedure Date: 07/19/2024 10:49 AM MRN: [id number] Date of Birth: March 31, 1966 Attending MD: Lamar Ozell Hollingshead , MD, [id number] CSN: [id number] Age: 58 Admit Type: Outpatient Procedure:                Colonoscopy Indications:              High risk colon cancer surveillance: Personal                            history of colonic polyps Providers:                Lamar Ozell Hollingshead, MD, Crystal Page, Alm Dorcas Balm., Technician Referring MD:              Medicines:                Propofol  per Anesthesia Complications:            No immediate complications. Estimated Blood Loss:     Estimated blood loss was minimal. Procedure:                Pre-Anesthesia Assessment:                           - Prior to the procedure, a History and Physical                            was performed, and patient medications and                            allergies were reviewed. The patient's tolerance of                            previous anesthesia was also reviewed. The risks                            and benefits of the procedure and the sedation                            options and risks were discussed with the patient.                            All questions were answered, and informed consent                            was obtained. Prior Anticoagulants: The patient has                            taken no anticoagulant or antiplatelet agents. ASA                            Grade Assessment: III - A patient with severe  systemic disease. After reviewing the risks and                            benefits, the patient was deemed in satisfactory                            condition to undergo the procedure.                           After obtaining informed consent, the colonoscope                            was passed under direct vision. Throughout the                             procedure, the patient's blood pressure, pulse, and                            oxygen saturations were monitored continuously. The                            [id number] ([id number]) Colon was introduced through                            the anus and advanced to the the cecum, identified                            by appendiceal orifice and ileocecal valve. The                            colonoscopy was performed without difficulty. The                            patient tolerated the procedure well. The quality                            of the bowel preparation was adequate. The                            ileocecal valve, appendiceal orifice, and rectum                            were photographed. Scope In: 11:15:27 AM Scope Out: 11:29:11 AM Total Procedure Duration: 0 hours 13 minutes 44 seconds  Findings:      The perianal and digital rectal examinations were normal.      Multiple medium-mouthed diverticula were found in the transverse colon       and ascending colon.      A 6 mm polyp was found in the hepatic flexure. The polyp was sessile.       The polyp was removed with a hot snare. Resection and retrieval were       complete. Estimated blood loss: none.      Two sessile polyps were found in the distal rectum. The polyps were 2 to  3 mm in size. These polyps were removed with a cold snare. Resection and       retrieval were complete. Estimated blood loss was minimal.      The exam was otherwise without abnormality on direct and retroflexion       views. Impression:               - Diverticulosis in the transverse colon and in the                            ascending colon.                           - One 6 mm polyp at the hepatic flexure, removed                            with a hot snare. Resected and retrieved.                           - Two 2 to 3 mm polyps in the distal rectum,                            removed with a cold snare. Resected and retrieved.                            - The examination was otherwise normal on direct                            and retroflexion views. Moderate Sedation:      Moderate (conscious) sedation was personally administered by an       anesthesia professional. The following parameters were monitored: oxygen       saturation, heart rate, blood pressure, respiratory rate, EKG, adequacy       of pulmonary ventilation, and response to care. Recommendation:           - Patient has a contact number available for                            emergencies. The signs and symptoms of potential                            delayed complications were discussed with the                            patient. Return to normal activities tomorrow.                            Written discharge instructions were provided to the                            patient.                           - Resume previous diet.                           -  Continue present medications.                           - Repeat colonoscopy date to be determined after                            pending pathology results are reviewed for                            surveillance after piecemeal polypectomy.                           - Return to GI office (date not yet determined). Procedure Code(s):        --- Professional ---                           [phone], Colonoscopy, flexible; with removal of                            tumor(s), polyp(s), or other lesion(s) by snare                            technique Diagnosis Code(s):        --- Professional ---                           Z86.010, Personal history of colonic polyps                           D12.3, Benign neoplasm of transverse colon (hepatic                            flexure or splenic flexure)                           D12.8, Benign neoplasm of rectum                           K57.30, Diverticulosis of large intestine without                            perforation or abscess without bleeding CPT copyright 2022  American Medical Association. All rights reserved. The codes documented in this report are preliminary and upon coder review may  be revised to meet current compliance requirements. Lamar HERO. Laurens Matheny, MD Lamar Ozell Hollingshead, MD 07/19/2024 11:36:58 AM This report has been signed electronically. Number of Addenda: 0

## 2024-07-19 NOTE — Anesthesia Preprocedure Evaluation (Addendum)
 Anesthesia Evaluation  Patient identified by MRN, date of birth, ID band Patient awake    Reviewed: Allergy & Precautions, NPO status , Patient's Chart, lab work & pertinent test results  Airway Mallampati: II  TM Distance: >3 FB Neck ROM: Full    Dental  (+) Dental Advisory Given, Missing, Caps Cap right upper front incisor:   Pulmonary former smoker   Pulmonary exam normal breath sounds clear to auscultation       Cardiovascular Exercise Tolerance: Good hypertension, Pt. on medications + Past MI  Normal cardiovascular exam Rhythm:Regular Rate:Normal  1. Left ventricular ejection fraction, by estimation, is >75%. The left ventricle has hyperdynamic function. The left ventricle has no regional  wall motion abnormalities. Left ventricular diastolic parameters are consistent with Grade I diastolic  dysfunction (impaired relaxation).  2. Right ventricular systolic function is normal. The right ventricular size is normal.  3. The mitral valve is normal in structure. No evidence of mitral valve regurgitation. No evidence of mitral stenosis.  4. The aortic valve is tricuspid. Aortic valve regurgitation is not visualized. No aortic stenosis is present.  5. The inferior vena cava is normal in size with greater than 50% respiratory variability, suggesting right atrial pressure of 3 mmHg   Neuro/Psych negative neurological ROS  negative psych ROS   GI/Hepatic negative GI ROS, Neg liver ROS,,,  Endo/Other  diabetes, Well Controlled, Type 2, Oral Hypoglycemic Agents    Renal/GU negative Renal ROS  negative genitourinary   Musculoskeletal  (+) Arthritis  (gout),    Abdominal   Peds negative pediatric ROS (+)  Hematology negative hematology ROS (+)   Anesthesia Other Findings Referring MD: Lari Elspeth BRAVO, MD   History of Present Illness:   Travis Paul is a 58 y.o. male with a hx of CAD, HTN, HLD who was  previously followed by Dr. Charls who now returns to clinic for follow-up.  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 Echo on 02/10/2017 demonstrated vigorous left ventricular systolic function, LVEF 70 to 24%, mild LVH, G1 DD.  Was seen by Prentice Medley on 06/2020 with continued mild chest pain that was constant in nature. Myoview  was obtained and was low risk with no ischemia.  Repeat TTE in 06/2020 with EF >75%, G1DD, normal RV, no significant valve disease.  Was last seen in clinic on 09/2021 where he continued to have atypical chest pain. Otherwise was doing well.   Today, the patient continues to have intermittent right sided chest pain. Feels like gas/fullness in the chest. Sometimes improves with belching. Symptoms not worsened by exertion.   Also notes he has been having dyspnea on exertion and was told he had something in his lungs on CT chest on 01/2022. Review of the CT scan showed a 10.62mm nodule and he is planned for repeat imaging today to reassess the nodule. We discussed that dyspnea could be related to heart artery disease (although I did not see any significant coronary calcium  on his CT chest) but he would like to have his lungs worked up first. We can bring him back in 1 month to reassess.   Otherwise no LE edema, orthopnea, PND, palpitations, dizziness, syncope. Does not monitor blood pressure at home. Compliant with his medications.    Reproductive/Obstetrics negative OB ROS  Anesthesia Physical Anesthesia Plan  ASA: 3  Anesthesia Plan: General   Post-op Pain Management: Minimal or no pain anticipated   Induction: Intravenous  PONV Risk Score and Plan: Propofol  infusion  Airway Management Planned: Natural Airway and Nasal Cannula  Additional  Equipment: None  Intra-op Plan:   Post-operative Plan:   Informed Consent: I have reviewed the patients History and Physical, chart, labs and discussed the procedure including the risks, benefits and alternatives for the proposed anesthesia with the patient or authorized representative who has indicated his/her understanding and acceptance.     Dental advisory given  Plan Discussed with: CRNA  Anesthesia Plan Comments:          Anesthesia Quick Evaluation

## 2024-07-19 NOTE — Transfer of Care (Signed)
 Immediate Anesthesia Transfer of Care Note  Patient: Travis Paul  Procedure(s) Performed: COLONOSCOPY  Patient Location: Short Stay  Anesthesia Type:General  Level of Consciousness: drowsy, patient cooperative, and responds to stimulation  Airway & Oxygen Therapy: Patient Spontanous Breathing  Post-op Assessment: Report given to RN and Post -op Vital signs reviewed and stable  Post vital signs: Reviewed and stable  Last Vitals:  Vitals Value Taken Time  BP 89/50 07/19/24 11:34  Temp    Pulse 58 07/19/24 11:34  Resp 11 07/19/24 11:34  SpO2 98 % 07/19/24 11:34    Last Pain:  Vitals:   07/19/24 1134  TempSrc: Oral  PainSc: Asleep         Complications: No notable events documented.

## 2024-07-20 LAB — SURGICAL PATHOLOGY

## 2024-07-21 ENCOUNTER — Encounter (HOSPITAL_COMMUNITY): Payer: Self-pay | Admitting: Internal Medicine

## 2024-07-21 ENCOUNTER — Ambulatory Visit: Payer: Self-pay | Admitting: Internal Medicine

## 2024-09-25 ENCOUNTER — Ambulatory Visit: Attending: Nurse Practitioner | Admitting: Nurse Practitioner

## 2024-09-25 ENCOUNTER — Encounter: Payer: Self-pay | Admitting: Nurse Practitioner

## 2024-09-25 VITALS — BP 158/88 | HR 92 | Ht 72.0 in | Wt 232.0 lb

## 2024-09-25 DIAGNOSIS — E7849 Other hyperlipidemia: Secondary | ICD-10-CM | POA: Diagnosis not present

## 2024-09-25 DIAGNOSIS — R079 Chest pain, unspecified: Secondary | ICD-10-CM | POA: Diagnosis not present

## 2024-09-25 DIAGNOSIS — M7989 Other specified soft tissue disorders: Secondary | ICD-10-CM

## 2024-09-25 DIAGNOSIS — R202 Paresthesia of skin: Secondary | ICD-10-CM | POA: Diagnosis not present

## 2024-09-25 DIAGNOSIS — I10 Essential (primary) hypertension: Secondary | ICD-10-CM

## 2024-09-25 DIAGNOSIS — Z87898 Personal history of other specified conditions: Secondary | ICD-10-CM

## 2024-09-25 MED ORDER — BLOOD PRESSURE CUFF MISC: 1.0000 | Freq: Every day | 0 refills | Status: AC

## 2024-09-25 NOTE — Progress Notes (Unsigned)
 " Cardiology Office Note   Date:  09/25/2024 ID:  Travis Paul, DOB Jun 16, 1966, MRN [id number] PCP: Loring Lye, MD  [facility] Providers Cardiologist:  Diannah SHAUNNA Maywood, MD     History of Present Illness Travis Paul is a 59 y.o. male with a PMH of chest pain, HTN, T2DM, HLD, who presents today for 6 month follow-up.   Previous nuclear medicine stress test in 2021 showed possible inferior infarct but no evidence of ischemia.  Last seen by Dr. Mallipeddi on 10/08/2022.  Noted atypical chest pain but continued on, located on right side of chest that would last for several days before resolved completely and would recur in 2 to 3 weeks.  Also had some shortness of breath but denied any major DOE.  Coronary CTA was arranged that showed coronary calcium  score of 0 with no significant extracardiac findings.  He is here for overdue follow-up.  He states he has had tingling noted in his left arm, and also states he has noticed swelling in his feet more recently. Does not check his blood pressure at home. Denies any recent chest pain, shortness of breath, palpitations, syncope, presyncope, dizziness, orthopnea, PND, swelling or significant weight changes, acute bleeding, or claudication.   SH: He works as a naval architect. Former smoker. Denies any illicit drug use. Very seldomly drinks alcohol.   ROS: Negative. See HPI.   Studies Reviewed  EKG:  EKG Interpretation Date/Time:  Monday September 25 2024 10:35:25 EST Ventricular Rate:  92 PR Interval:  180 QRS Duration:  96 QT Interval:  368 QTC Calculation: 455 R Axis:   -26  Text Interpretation: Normal sinus rhythm Normal ECG When compared with ECG of 15-Jun-2022 11:50, T wave inversion no longer evident in Inferior leads T wave amplitude has increased in Anterior leads Confirmed by Miriam Norris [phone]) on 09/25/2024 10:38:50 AM   CCTA 09/2022:  IMPRESSION: 1. Coronary calcium  score of 0.   2. Normal coronary  origin with right dominance.   3. No evidence of CAD.  Echo 06/2020:  1. Left ventricular ejection fraction, by estimation, is >75%. The left  ventricle has hyperdynamic function. The left ventricle has no regional  wall motion abnormalities. Left ventricular diastolic parameters are  consistent with Grade I diastolic  dysfunction (impaired relaxation).   2. Right ventricular systolic function is normal. The right ventricular  size is normal.   3. The mitral valve is normal in structure. No evidence of mitral valve  regurgitation. No evidence of mitral stenosis.   4. The aortic valve is tricuspid. Aortic valve regurgitation is not  visualized. No aortic stenosis is present.   5. The inferior vena cava is normal in size with greater than 50%  respiratory variability, suggesting right atrial pressure of 3 mmHg.   Comparison(s): Echocardiogram done 01/13/17 showed an EF of 70%.  Lexiscan  06/2020:  No diagnostic ST segment changes to indicate ischemia. Small, moderate intensity, fixed inferior defect most prominent on rest imaging and in the mid to apical region. Gated imaging indicates normal wall motion suggesting soft tissue attenuation, although scar cannot be completely excluded. No definite ischemia. This is a low risk study. Nuclear stress EF: 62%.     Physical Exam VS:  BP 123/74   Pulse 92   Ht 6' (1.829 m)   Wt 232 lb (105.2 kg)   SpO2 98%   BMI 31.46 kg/m        Wt Readings from Last 3 Encounters:  09/25/24 232  lb (105.2 kg)  07/05/24 230 lb 8 oz (104.6 kg)  10/08/22 242 lb 9.6 oz (110 kg)    GEN: Well nourished, well developed in no acute distress NECK: No JVD; No carotid bruits CARDIAC: S1/S2, RRR, no murmurs, rubs, gallops RESPIRATORY:  Clear to auscultation without rales, wheezing or rhonchi  ABDOMEN: Soft, non-tender, non-distended EXTREMITIES:  No edema; No deformity   ASSESSMENT AND PLAN  HTN BP elevated on arrival, and on recheck was WNL. Possible WCH.  Discussed to monitor BP at home at least 2 hours after medications and sitting for 5-10 minutes. Will provide him a Rx for a BP cuff. Discussed SBP goal < 130. Discussed when to notify office. Will obtain CBC, CMET. Heart healthy diet and regular cardiovascular exercise encouraged.   HLD Due for labs. Will obtain CMET and FLP. Continue Crestor . Heart healthy diet and regular cardiovascular exercise encouraged.   Feet swelling   Trace, mild nonpitting edema on exam. No indication for diuretic. Recommended compression stockings and leg elevation PRN. Heart healthy diet and regular cardiovascular exercise encouraged. Consider lowering Norvasc  dose if not improved by next office visit.   4. Tingling Atypical and etiology multifactorial, recommended to f/u with PCP for further evaluation.    Dispo: Care and ED precautions discussed. Follow-up with MD/APP in 3 months or sooner if anything changes.   Signed, Almarie Crate, NP   "

## 2024-09-25 NOTE — Patient Instructions (Signed)
 Medication Instructions:  Your physician recommends that you continue on your current medications as directed. Please refer to the Current Medication list given to you today.   Labwork: CBC, CMET and Fasting Lipid Panel to be completed at [facility] in 2-3 weeks  Testing/Procedures: None  Follow-Up: Your physician recommends that you schedule a follow-up appointment in: 3 months  Any Other Special Instructions Will Be Listed Below (If Applicable).   If you need a refill on your cardiac medications before your next appointment, please call your pharmacy.

## 2024-09-27 ENCOUNTER — Other Ambulatory Visit (HOSPITAL_COMMUNITY): Payer: Self-pay | Admitting: Family Medicine

## 2024-09-27 DIAGNOSIS — R109 Unspecified abdominal pain: Secondary | ICD-10-CM

## 2024-09-27 DIAGNOSIS — R310 Gross hematuria: Secondary | ICD-10-CM

## 2024-09-28 ENCOUNTER — Ambulatory Visit (HOSPITAL_COMMUNITY)
Admission: RE | Admit: 2024-09-28 | Discharge: 2024-09-28 | Disposition: A | Discharge status: Home / Self Care | Source: Ambulatory Visit | Attending: Family Medicine | Admitting: Family Medicine

## 2024-09-28 DIAGNOSIS — R310 Gross hematuria: Secondary | ICD-10-CM | POA: Insufficient documentation

## 2024-10-11 ENCOUNTER — Ambulatory Visit (HOSPITAL_COMMUNITY)
Admission: RE | Admit: 2024-10-11 | Discharge: 2024-10-11 | Disposition: A | Discharge status: Home / Self Care | Source: Ambulatory Visit | Attending: Urology | Admitting: Urology

## 2024-10-11 ENCOUNTER — Ambulatory Visit: Admitting: Urology

## 2024-10-11 ENCOUNTER — Encounter: Payer: Self-pay | Admitting: Urology

## 2024-10-11 ENCOUNTER — Other Ambulatory Visit: Payer: Self-pay

## 2024-10-11 VITALS — BP 129/81 | HR 84

## 2024-10-11 DIAGNOSIS — N200 Calculus of kidney: Secondary | ICD-10-CM

## 2024-10-11 DIAGNOSIS — R801 Persistent proteinuria, unspecified: Secondary | ICD-10-CM

## 2024-10-11 DIAGNOSIS — R351 Nocturia: Secondary | ICD-10-CM | POA: Diagnosis not present

## 2024-10-11 DIAGNOSIS — N401 Enlarged prostate with lower urinary tract symptoms: Secondary | ICD-10-CM

## 2024-10-11 MED ORDER — ALFUZOSIN HCL ER 10 MG PO TB24: 10.0000 mg | ORAL_TABLET | Freq: Every day | ORAL | 11 refills | Status: AC

## 2024-10-11 NOTE — Progress Notes (Signed)
 Patient blood pressure value first attempt is high. A repeat blood pressure check completed.  Does patient have a PCP? Yes Has the patient taken then blood pressure medicine today? Yes  Discussed with patient the importance of reviewing their blood pressure with PCP and adhering to their blood medication is the patient is prescribed any. Patient voiced understanding.

## 2024-10-11 NOTE — Progress Notes (Signed)
 "  10/11/2024 2:26 PM   Travis Paul August 17, 1966 [id number]  Referring provider: Loring Lye, MD [address] [place],  [place] [address]  nephrolithiasis   HPI: Mr Travis Paul is a 59yo here for evl;aution of nephrolithiasis. 12 days ago he developed right flank pain and presented to the ER. He was diagnosed with a 7mm left lower pole calculus. He has had multiple stone events in the past and has required ureteroscopy in the past. IPSS 21 QOL 3 on no BPH therapy. Urine stream is fair. He has straining to urinate. Nocturia 4-5x. UA today shows 3+ protein. He has foamy urine for the past 4-5 months.    PMH: Past Medical History:  Diagnosis Date   Achilles tendinitis, right leg    Diabetes mellitus without complication (HCC)    Hyperlipidemia    Hypertension    Idiopathic gout, unspecified site    Mallet finger of left hand    Old myocardial infarction     Surgical History: Past Surgical History:  Procedure Laterality Date   COLONOSCOPY N/A 07/21/2019   Procedure: COLONOSCOPY;  Surgeon: Shaaron Lamar HERO, MD;  [facility];  Service: Endoscopy;  Laterality: N/A;  9:30   COLONOSCOPY N/A 07/19/2024   Procedure: COLONOSCOPY;  Surgeon: Shaaron Lamar HERO, MD;  [facility]: [facility];  Service: Endoscopy;  Laterality: N/A;  10:15am, ASA 2   CYSTOSCOPY WITH RETROGRADE PYELOGRAM, URETEROSCOPY AND STENT PLACEMENT Right 06/17/2022   Procedure: CYSTOSCOPY WITH RETROGRADE PYELOGRAM, URETEROSCOPY AND STENT PLACEMENT;  Surgeon: Roseann Adine PARAS., MD;  Location: [facility];  Service: Urology;  Laterality: Right;   HOLMIUM LASER APPLICATION Right 06/17/2022   Procedure: HOLMIUM LASER APPLICATION;  Surgeon: Roseann Adine PARAS., MD;  Location: [facility];  Service: Urology;  Laterality: Right;   LUNG SURGERY  07/05/2019   from a lung injury   NO PAST SURGERIES     POLYPECTOMY  07/21/2019   Procedure: POLYPECTOMY;  Surgeon: Shaaron Lamar HERO, MD;  Location: [facility];  Service:  Endoscopy;;  colon    Home Medications:  Allergies as of 10/11/2024       Reactions   Morphine  And Codeine Itching        Medication List        Accurate as of October 11, 2024  2:26 PM. If you have any questions, ask your nurse or doctor.          amLODipine  10 MG tablet Commonly known as: NORVASC  Take 1 tablet (10 mg total) by mouth daily.   benazepril  40 MG tablet Commonly known as: LOTENSIN  Take 1 tablet (40 mg total) by mouth daily.   Blood Pressure Cuff Misc 1 kit by Does not apply route daily.   Dexcom G7 Sensor Misc as directed.   fenofibrate  160 MG tablet Take 1 tablet by mouth once daily   HAIR/SKIN/NAILS PO Take 1 tablet by mouth daily.   metFORMIN 1000 MG tablet Commonly known as: GLUCOPHAGE Take 1,000 mg by mouth 2 (two) times daily with a meal.   nitroGLYCERIN  0.4 MG SL tablet Commonly known as: NITROSTAT  Place 1 tablet (0.4 mg total) under the tongue every 5 (five) minutes as needed for chest pain.   rosuvastatin  10 MG tablet Commonly known as: CRESTOR  Take 1 tablet (10 mg total) by mouth daily.        Allergies: Allergies[1]  Family History: Family History  Problem Relation Age of Onset   Liver cancer Mother    Colon polyps Paternal Uncle  Social History:  reports that he quit smoking about 14 years ago. His smoking use included cigarettes. He smoked an average of 1 pack per day. He has never used smokeless tobacco. He reports current alcohol use. He reports that he does not use drugs.  ROS: All other review of systems were reviewed and are negative except what is noted above in HPI  Physical Exam: BP 129/81   Pulse 84   Constitutional:  Alert and oriented, No acute distress. HEENT: [place] AT, moist mucus membranes.  Trachea midline, no masses. Cardiovascular: No clubbing, cyanosis, or edema. Respiratory: Normal respiratory effort, no increased work of breathing. GI: Abdomen is soft, nontender, nondistended, no abdominal  masses GU: No CVA tenderness.  Lymph: No cervical or inguinal lymphadenopathy. Skin: No rashes, bruises or suspicious lesions. Neurologic: Grossly intact, no focal deficits, moving all 4 extremities. Psychiatric: Normal mood and affect.  Laboratory Data: No results found for: WBC, HGB, HCT, MCV, PLT  Lab Results  Component Value Date   CREATININE 1.20 10/13/2022    No results found for: PSA  No results found for: TESTOSTERONE   No results found for: HGBA1C  Urinalysis    Component Value Date/Time   APPEARANCEUR Hazy (A) 07/21/2022 1429   GLUCOSEU Negative 07/21/2022 1429   BILIRUBINUR Negative 07/21/2022 1429   PROTEINUR 3+ (A) 07/21/2022 1429   NITRITE Negative 07/21/2022 1429   LEUKOCYTESUR Negative 07/21/2022 1429    Lab Results  Component Value Date   LABMICR See below: 07/21/2022   WBCUA 0-5 07/21/2022   LABEPIT 0-10 07/21/2022   MUCUS Present (A) 07/21/2022   BACTERIA None seen 07/21/2022    Pertinent Imaging: CT 09/28/24 and KUB today: Images reviewed and discussed with the patient  Results for orders placed during the hospital encounter of 04/28/22  DG Abd 1 View  Narrative CLINICAL DATA:  Pre lithotripsy, right side.  Lower back pain.  EXAM: ABDOMEN - 1 VIEW  COMPARISON:  03/25/2022.  FINDINGS: A focally dilated loop of small bowel is noted in the mid left abdomen measuring up to 4 cm. No renal calculus is identified. Stable phleboliths are present in the pelvis.  IMPRESSION: 1. The previously described left renal calculus is not seen on this exam possibly due to overlying bowel contents. 2. Focally dilated loop of small bowel in the mid left abdomen measuring up to 4 cm, possible focal ileus versus partial or early obstruction.   Electronically Signed By: Leita Birmingham M.D. On: 04/28/2022 23:23  No results found for this or any previous visit.  No results found for this or any previous visit.  No results found for  this or any previous visit.  Results for orders placed during the hospital encounter of 07/22/22  US  RENAL  Narrative CLINICAL DATA:  RIGHT ureteral calculus. Post cystoscopy with retrograde pyelogram and RIGHT-sided stent placement 06/17/2022  EXAM: RENAL / URINARY TRACT ULTRASOUND COMPLETE  COMPARISON:  CT abdomen and pelvis 01/02/2022  FINDINGS: Right Kidney:  Renal measurements: 13.6 x 6.5 x 6.4 cm = volume: 297 mL. Normal cortical thickness and echogenicity. No mass, hydronephrosis, or shadowing calcification. Ureteral stent not visualized by ultrasound.  Left Kidney:  Renal measurements: 11.8 x 7.5 x 6.3 cm = volume: 295 mL. Normal cortical thickness and echogenicity. No mass, hydronephrosis, or shadowing calcification.  Bladder:  Appears normal for degree of bladder distention. Ureteral stent was not visualized.  Other:  None.  IMPRESSION: No renal mass or hydronephrosis identified.  No ureteral stent visualized.  Electronically Signed By: Oneil Kiss M.D. On: 07/22/2022 17:26  No results found for this or any previous visit.  Results for orders placed during the hospital encounter of 01/02/22  CT HEMATURIA WORKUP  Narrative CLINICAL DATA:  Microscopic hematuria.  EXAM: CT ABDOMEN AND PELVIS WITHOUT AND WITH CONTRAST  TECHNIQUE: Multidetector CT imaging of the abdomen and pelvis was performed following the standard protocol before and following the bolus administration of intravenous contrast.  RADIATION DOSE REDUCTION: This exam was performed according to the departmental dose-optimization program which includes automated exposure control, adjustment of the mA and/or kV according to patient size and/or use of iterative reconstruction technique.  CONTRAST:  OMNIPAQUE  IOHEXOL  300 MG/ML  SOLN  COMPARISON:  None.  FINDINGS: Lower Chest: No acute findings.  Hepatobiliary: No hepatic masses identified. Gallbladder is unremarkable.  No evidence of biliary ductal dilatation.  Pancreas:  No mass or inflammatory changes.  Spleen: Within normal limits in size and appearance.  Adrenals/Urinary Tract: No adrenal masses identified. 4 mm calculus noted in lower pole of left kidney. Mild right ureterectasis is seen without pelvicaliectasis. A 4 mm calculus is seen in the distal right ureter. No renal masses identified. No masses seen involving the collecting systems, ureters, or bladder. 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  Stomach/Bowel: No evidence of obstruction, inflammatory process or abnormal fluid collections. Normal appendix visualized. Predominantly right-sided colonic diverticulosis is noted, however there is no evidence of diverticulitis.  Vascular/Lymphatic: No pathologically enlarged lymph nodes. No acute vascular findings. Aortic atherosclerotic calcification noted.  Reproductive:  No mass or other significant abnormality.  Other:  None.  Musculoskeletal:  No suspicious bone lesions identified.  IMPRESSION: 4 mm distal right ureteral calculus, with mild right ureterectasis.  4 mm nonobstructing left renal calculus.  No radiographic evidence of urinary tract neoplasm.  Colonic diverticulosis, without radiographic evidence of diverticulitis.  Aortic Atherosclerosis (ICD10-I70.0).   Electronically Signed By: Norleen DELENA Kil M.D. On: 01/03/2022 13:15  No results found for this or any previous visit.   Assessment & Plan:    1. Kidney stone (Primary) -We discussed the management of kidney stones. These options include observation, ureteroscopy, shockwave lithotripsy (ESWL) and percutaneous nephrolithotomy (PCNL). We discussed which options are relevant to the patient's stone(s). We discussed the natural history of kidney stones as well as the complications of untreated stones and the impact on quality of life without treatment as well  as with each of the above listed treatments. We also discussed the efficacy of each treatment in its ability to clear the stone burden. With any of these management options I discussed the signs and symptoms of infection and the need for emergent treatment should these be experienced. For each option we discussed the ability of each procedure to clear the patient of their stone burden.   For observation I described the risks which include but are not limited to silent renal damage, life-threatening infection, need for emergent surgery, failure to pass stone and pain.   For ureteroscopy I described the risks which include bleeding, infection, damage to contiguous structures, positioning injury, ureteral stricture, ureteral avulsion, ureteral injury, need for prolonged ureteral stent, inability to perform ureteroscopy, need for an interval procedure, inability to clear stone burden, stent discomfort/pain, heart attack, stroke, pulmonary embolus and the inherent risks with general anesthesia.   For shockwave lithotripsy I described the risks which include arrhythmia, kidney contusion, kidney hemorrhage, need for transfusion, pain, inability to adequately  break up stone, inability to pass stone fragments, Steinstrasse, infection associated with obstructing stones, need for alternate surgical procedure, need for repeat shockwave lithotripsy, MI, CVA, PE and the inherent risks with anesthesia/conscious sedation.   For PCNL I described the risks including positioning injury, pneumothorax, hydrothorax, need for chest tube, inability to clear stone burden, renal laceration, arterial venous fistula or malformation, need for embolization of kidney, loss of kidney or renal function, need for repeat procedure, need for prolonged nephrostomy tube, ureteral avulsion, MI, CVA, PE and the inherent risks of general anesthesia.   - The patient would like to proceed with left ESWL - Urinalysis, Routine w reflex  microscopic  2. BPh with nocturia -we will trial uroxatral  10mg  at bedtime  3. Proteinuria BMP today and referral to Dr. Rachele  No follow-ups on file.  Belvie Clara, MD  [facility] [place]      [1]  Allergies Allergen Reactions   Morphine  And Codeine Itching   "

## 2024-10-11 NOTE — Patient Instructions (Signed)
 ESWL for Kidney Stones  Extracorporeal shock wave lithotripsy (ESWL) is a treatment that can help break up kidney stones that are too large to pass on their own.  This is a nonsurgical procedure that breaks up a kidney stone with shock waves. These shock waves pass through your body and focus on the kidney stone. They cause the kidney stone to break into smaller pieces (fragments) while it is still in the urinary tract. The fragments of stone can pass more easily out of your body in the pee (urine). Tell a health care provider about: Any allergies you have. All medicines you are taking, including vitamins, herbs, eye drops, creams, and over-the-counter medicines. Any problems you or family members have had with anesthetic medicines. Any bleeding problems you have. Any surgeries you've had. Any medical conditions you have. Whether you're pregnant or may be pregnant. What are the risks? Your health care provider will talk with you about risks. These may include: Infection. Bleeding from the kidney. Bruising of the kidney or skin. Scarring of the kidney. This can lead to: Increased blood pressure. Poor kidney function. Return (recurrence) of kidney stones. Damage to other structures or organs. This may include the liver, colon, spleen, or pancreas. Blockage (obstruction) of the tube that carries pee from the kidney to the bladder (ureter). Failure of the kidney stone to break into fragments. What happens before the procedure? When to stop eating and drinking Follow instructions from your health care provider about what you may eat and drink. These may include: 8 hours before your procedure Stop eating most foods. Do not eat meat, fried foods, or fatty foods. Eat only light foods, such as toast or crackers. All liquids are okay except energy drinks and alcohol. 6 hours before your procedure Stop eating. Drink only clear liquids, such as water, clear fruit juice, black coffee, plain tea,  and sports drinks. Do not drink energy drinks or alcohol. 2 hours before your procedure Stop drinking all liquids. You may be allowed to take medicines with small sips of water. If you do not follow your health care provider's instructions, your procedure may be delayed or canceled. Medicines Ask your health care provider about: Changing or stopping your regular medicines. These include any diabetes medicines or blood thinners you take. Taking medicines such as aspirin and ibuprofen. These medicines can thin your blood. Do not take them unless your health care provider tells you to. Taking over-the-counter medicines, vitamins, herbs, and supplements. Tests You may have tests, such as: Blood tests. Pee (urine) tests. Imaging tests. This may include a CT scan. Surgery safety Ask your health care provider: How your surgery site will be marked. What steps will be taken to help prevent infection. These steps may include: Washing skin with a soap that kills germs. Receiving antibiotics. General instructions If you will be going home right after the procedure, plan to have a responsible adult: Take you home from the hospital or clinic. You will not be allowed to drive. Care for you for the time you are told. What happens during the procedure?  An IV will be inserted into one of your veins. You may be given: A sedative. This helps you relax. Anesthesia. This will: Numb certain areas of your body. Make you fall asleep for surgery. A water-filled cushion may be placed behind your kidney or on your abdomen. In some cases, you may be placed in a tub of lukewarm water. Your body will be positioned in a way that makes it  easier to target the kidney stone. An X-ray or ultrasound exam will be done to locate your stone. Shock waves will be aimed at the stone. If you are awake, you may feel a tapping sensation as the shock waves pass through your body. A small mesh tube (stent) may be placed in  your ureter. This will help keep pee flowing from the kidney if the fragments of the stone have been blocking the ureter. The stent will be removed at a later time by your health care provider. The procedure may vary among health care providers and hospitals. What happens after the procedure? Your blood pressure, heart rate, breathing rate, and blood oxygen level will be monitored until you leave the hospital or clinic. You may have an X-ray after the procedure to see how many of the kidney stones were broken up. This will also show how much of the stone has passed. If there are still large fragments after treatment, you may need to have a second procedure at a later time. This information is not intended to replace advice given to you by your health care provider. Make sure you discuss any questions you have with your health care provider. Document Revised: 03/13/2023 Document Reviewed: 01/01/2022 Elsevier Patient Education  2024 [employer].

## 2024-10-12 ENCOUNTER — Other Ambulatory Visit: Payer: Self-pay

## 2024-10-12 ENCOUNTER — Encounter (HOSPITAL_COMMUNITY): Payer: Self-pay

## 2024-10-12 DIAGNOSIS — N200 Calculus of kidney: Secondary | ICD-10-CM

## 2024-10-12 LAB — BASIC METABOLIC PANEL WITH GFR
BUN/Creatinine Ratio: 14 (ref 10–24)
BUN: 20 mg/dL (ref 8–27)
CO2: 26 mmol/L (ref 20–29)
Calcium: 9.9 mg/dL (ref 8.6–10.2)
Chloride: 103 mmol/L (ref 96–106)
Creatinine, Ser: 1.47 mg/dL — ABNORMAL HIGH (ref 0.76–1.27)
Glucose: 131 mg/dL — ABNORMAL HIGH (ref 70–99)
Potassium: 4.1 mmol/L (ref 3.5–5.2)
Sodium: 145 mmol/L — ABNORMAL HIGH (ref 134–144)
eGFR: 54 mL/min/{1.73_m2} — ABNORMAL LOW

## 2024-10-12 LAB — MICROSCOPIC EXAMINATION: Bacteria, UA: NONE SEEN

## 2024-10-12 LAB — URINALYSIS, ROUTINE W REFLEX MICROSCOPIC
Bilirubin, UA: NEGATIVE
Glucose, UA: NEGATIVE
Ketones, UA: NEGATIVE
Nitrite, UA: NEGATIVE
RBC, UA: NEGATIVE
Specific Gravity, UA: 1.025 (ref 1.005–1.030)
Urobilinogen, Ur: 2 mg/dL — ABNORMAL HIGH (ref 0.2–1.0)
pH, UA: 6 (ref 5.0–7.5)

## 2024-10-13 ENCOUNTER — Encounter (HOSPITAL_COMMUNITY)
Admission: RE | Admit: 2024-10-13 | Discharge: 2024-10-13 | Disposition: A | Discharge status: Home / Self Care | Source: Ambulatory Visit | Attending: Urology | Admitting: Urology

## 2024-10-17 ENCOUNTER — Encounter (HOSPITAL_COMMUNITY): Admission: RE | Payer: Self-pay | Source: Home / Self Care

## 2024-10-17 ENCOUNTER — Ambulatory Visit (HOSPITAL_COMMUNITY): Admission: RE | Admit: 2024-10-17 | Source: Home / Self Care | Admitting: Urology

## 2024-11-20 ENCOUNTER — Ambulatory Visit: Admitting: Urology

## 2024-12-25 ENCOUNTER — Ambulatory Visit: Admitting: Nurse Practitioner
